# Patient Record
Sex: Female | Born: 1995 | Race: Black or African American | Hispanic: No | Marital: Single | Smoking: Never smoker
Health system: Southern US, Community
[De-identification: ages and names within clinical notes are randomized; demographics above are authoritative.]

## PROBLEM LIST (undated history)

## (undated) DIAGNOSIS — R011 Cardiac murmur, unspecified: Secondary | ICD-10-CM

## (undated) HISTORY — PX: TONSILLECTOMY: SUR1361

## (undated) HISTORY — PX: IUD REMOVAL: SHX5392

---

## 2019-01-06 ENCOUNTER — Other Ambulatory Visit: Payer: Self-pay

## 2019-01-06 ENCOUNTER — Emergency Department (HOSPITAL_COMMUNITY)
Admission: EM | Admit: 2019-01-06 | Discharge: 2019-01-06 | Disposition: A | Discharge status: Home / Self Care | Payer: BLUE CROSS/BLUE SHIELD | Attending: Emergency Medicine | Admitting: Emergency Medicine

## 2019-01-06 ENCOUNTER — Emergency Department (HOSPITAL_COMMUNITY): Payer: BLUE CROSS/BLUE SHIELD

## 2019-01-06 DIAGNOSIS — J029 Acute pharyngitis, unspecified: Secondary | ICD-10-CM | POA: Diagnosis present

## 2019-01-06 DIAGNOSIS — N938 Other specified abnormal uterine and vaginal bleeding: Secondary | ICD-10-CM

## 2019-01-06 DIAGNOSIS — J020 Streptococcal pharyngitis: Secondary | ICD-10-CM | POA: Diagnosis not present

## 2019-01-06 LAB — CBC WITH DIFFERENTIAL/PLATELET
Abs Immature Granulocytes: 0.01 10*3/uL (ref 0.00–0.07)
Basophils Absolute: 0 10*3/uL (ref 0.0–0.1)
Basophils Relative: 0 %
Eosinophils Absolute: 0 10*3/uL (ref 0.0–0.5)
Eosinophils Relative: 1 %
HEMATOCRIT: 39.6 % (ref 36.0–46.0)
HEMOGLOBIN: 11.4 g/dL — AB (ref 12.0–15.0)
Immature Granulocytes: 0 %
Lymphocytes Relative: 22 %
Lymphs Abs: 1.8 10*3/uL (ref 0.7–4.0)
MCH: 22.5 pg — ABNORMAL LOW (ref 26.0–34.0)
MCHC: 28.8 g/dL — ABNORMAL LOW (ref 30.0–36.0)
MCV: 78.3 fL — ABNORMAL LOW (ref 80.0–100.0)
Monocytes Absolute: 0.5 10*3/uL (ref 0.1–1.0)
Monocytes Relative: 6 %
Neutro Abs: 5.7 10*3/uL (ref 1.7–7.7)
Neutrophils Relative %: 71 %
Platelets: 552 10*3/uL — ABNORMAL HIGH (ref 150–400)
RBC: 5.06 MIL/uL (ref 3.87–5.11)
RDW: 15.3 % (ref 11.5–15.5)
WBC: 8.1 10*3/uL (ref 4.0–10.5)
nRBC: 0 % (ref 0.0–0.2)

## 2019-01-06 LAB — GROUP A STREP BY PCR: Group A Strep by PCR: DETECTED — AB

## 2019-01-06 LAB — BASIC METABOLIC PANEL
Anion gap: 8 (ref 5–15)
BUN: <5 mg/dL — ABNORMAL LOW (ref 6–20)
CO2: 24 mmol/L (ref 22–32)
Calcium: 9.3 mg/dL (ref 8.9–10.3)
Chloride: 107 mmol/L (ref 98–111)
Creatinine, Ser: 0.81 mg/dL (ref 0.44–1.00)
GFR calc Af Amer: >60 mL/min (ref >60)
GFR calc non Af Amer: >60 mL/min (ref >60)
Glucose, Bld: 84 mg/dL (ref 70–99)
POTASSIUM: 3.6 mmol/L (ref 3.5–5.1)
Sodium: 139 mmol/L (ref 135–145)

## 2019-01-06 MED ORDER — FERROUS SULFATE 325 (65 FE) MG PO TABS: 325.0000 mg | ORAL_TABLET | Freq: Every day | ORAL | 0 refills | Status: DC

## 2019-01-06 MED ORDER — IOHEXOL 300 MG/ML  SOLN
75.0000 mL | Freq: Once | INTRAMUSCULAR | Status: AC | PRN
  Administered 2019-01-06: 75 mL via INTRAVENOUS

## 2019-01-06 MED ORDER — CLINDAMYCIN HCL 150 MG PO CAPS
300.0000 mg | ORAL_CAPSULE | Freq: Once | ORAL | Status: AC
  Administered 2019-01-06: 300 mg via ORAL
  Filled 2019-01-06: qty 2

## 2019-01-06 MED ORDER — CLINDAMYCIN HCL 300 MG PO CAPS: 300.0000 mg | ORAL_CAPSULE | Freq: Four times a day (QID) | ORAL | 0 refills | Status: DC

## 2019-01-06 MED ORDER — IBUPROFEN 600 MG PO TABS: 600.0000 mg | ORAL_TABLET | Freq: Four times a day (QID) | ORAL | 0 refills | Status: DC | PRN

## 2019-01-06 MED ORDER — KETOROLAC TROMETHAMINE 30 MG/ML IJ SOLN
30.0000 mg | Freq: Once | INTRAMUSCULAR | Status: AC
  Administered 2019-01-06: 30 mg via INTRAVENOUS
  Filled 2019-01-06: qty 1

## 2019-01-06 MED ORDER — DEXAMETHASONE SODIUM PHOSPHATE 10 MG/ML IJ SOLN
10.0000 mg | Freq: Once | INTRAMUSCULAR | Status: AC
  Administered 2019-01-06: 10 mg via INTRAVENOUS
  Filled 2019-01-06: qty 1

## 2019-01-06 NOTE — ED Notes (Signed)
Patient transported to CT 

## 2019-01-06 NOTE — ED Triage Notes (Signed)
Pt dx with strep throat last week, finished penicillin two days ago, and throat is hurting again today.

## 2019-01-06 NOTE — ED Provider Notes (Signed)
[facility] Provider Note   CSN: [id number] Arrival date & time: 01/06/19  1607     History   Chief Complaint Chief Complaint  Patient presents with  . Sore Throat    HPI Karen Garner is a 23 y.o. female.  Pt presents to the ED today with a sore throat.  She was diagnosed with strep 10 days ago and took her abx.  She said the throat pain went away, but has now come back on the left side.  She denies any fever.     No past medical history on file.  There are no active problems to display for this patient.     OB History   No obstetric history on file.      Home Medications    Prior to Admission medications   Medication Sig Start Date End Date Taking? Authorizing Provider  clindamycin (CLEOCIN) 300 MG capsule Take 1 capsule (300 mg total) by mouth every 6 (six) hours. 01/06/19   Jacalyn LefevreHaviland, Adam Sanjuan, MD  ferrous sulfate 325 (65 FE) MG tablet Take 1 tablet (325 mg total) by mouth daily. 01/06/19   Jacalyn LefevreHaviland, Kai Railsback, MD  ibuprofen (ADVIL,MOTRIN) 600 MG tablet Take 1 tablet (600 mg total) by mouth every 6 (six) hours as needed. 01/06/19   Jacalyn LefevreHaviland, Amita Atayde, MD    Family History No family history on file.  Social History Social History   Tobacco Use  . Smoking status: Not on file  Substance Use Topics  . Alcohol use: Not on file  . Drug use: Not on file     Allergies   Patient has no known allergies.   Review of Systems Review of Systems  HENT: Positive for sore throat.   All other systems reviewed and are negative.    Physical Exam Updated Vital Signs BP 131/85 (BP Location: Right Arm)   Pulse 96   Temp 98.7 F (37.1 C) (Oral)   Resp 16   Ht 5\' 3"  (1.6 m)   Wt (!) 140.2 kg   SpO2 99%   BMI 54.74 kg/m   Physical Exam Vitals signs and nursing note reviewed.  Constitutional:      Appearance: She is well-developed. She is obese.  HENT:     Head: Normocephalic and atraumatic.     Right Ear: Tympanic membrane  normal.     Left Ear: There is impacted cerumen.     Mouth/Throat:     Pharynx: Posterior oropharyngeal erythema present.  Eyes:     Conjunctiva/sclera: Conjunctivae normal.     Pupils: Pupils are equal, round, and reactive to light.  Cardiovascular:     Rate and Rhythm: Normal rate and regular rhythm.  Pulmonary:     Effort: Pulmonary effort is normal.     Breath sounds: Normal breath sounds.  Abdominal:     General: Bowel sounds are normal.     Palpations: Abdomen is soft.  Lymphadenopathy:     Cervical: Cervical adenopathy present.     Left cervical: Superficial cervical adenopathy present.  Skin:    General: Skin is warm and dry.     Capillary Refill: Capillary refill takes less than 2 seconds.  Neurological:     General: No focal deficit present.     Mental Status: She is alert and oriented to person, place, and time.  Psychiatric:        Mood and Affect: Mood normal.        Behavior: Behavior normal.  ED Treatments / Results  Labs (all labs ordered are listed, but only abnormal results are displayed) Labs Reviewed  GROUP A STREP BY PCR - Abnormal; Notable for the following components:      Result Value   Group A Strep by PCR DETECTED (*)    All other components within normal limits  CBC WITH DIFFERENTIAL/PLATELET - Abnormal; Notable for the following components:   Hemoglobin 11.4 (*)    MCV 78.3 (*)    MCH 22.5 (*)    MCHC 28.8 (*)    Platelets 552 (*)    All other components within normal limits  BASIC METABOLIC PANEL - Abnormal; Notable for the following components:   BUN <5 (*)    All other components within normal limits  GC/CHLAMYDIA PROBE AMP ([place]) [facility]    EKG None  Radiology No results found.  Procedures Procedures (including critical care time)  Medications Ordered in ED Medications  dexamethasone (DECADRON) injection 10 mg (10 mg Intravenous Given 01/06/19 1638)  ketorolac (TORADOL) 30 MG/ML injection 30 mg (30 mg  Intravenous Given 01/06/19 1638)  clindamycin (CLEOCIN) capsule 300 mg (300 mg Oral Given 01/06/19 1818)     Initial Impression / Assessment and Plan / ED Course  I have reviewed the triage vital signs and the nursing notes.  Pertinent labs & imaging results that were available during my care of the patient were reviewed by me and considered in my medical decision making (see chart for details).    Hgb slightly low (11.2).  Pt is on depo, but has been having heavy bleeding for 2 months.  She will be put on iron.    Pt is feeling better after decadron and toradol.  CT soft tissue neck pending at shift change.  I anticipate it will be normal.  Pt requested to be checked for STDs in her throat.    Pt signed out to PA Gekas.   Final Clinical Impressions(s) / ED Diagnoses   Final diagnoses:  Strep pharyngitis  DUB (dysfunctional uterine bleeding)    ED Discharge Orders         Ordered    clindamycin (CLEOCIN) 300 MG capsule  Every 6 hours     01/06/19 1758    ferrous sulfate 325 (65 FE) MG tablet  Daily     01/06/19 1758    ibuprofen (ADVIL,MOTRIN) 600 MG tablet  Every 6 hours PRN     01/06/19 Lambert [id number]           Hezakiah Champeau, MD 01/06/19 [phone]

## 2019-01-06 NOTE — Discharge Instructions (Addendum)
Take Clindamycin for throat infection Take Ibuprofen for pain Take Iron because you are anemic (blood counts are slightly low) Return if worsening

## 2019-01-08 LAB — GC/CHLAMYDIA PROBE AMP (~~LOC~~) NOT AT ARMC
CHLAMYDIA, DNA PROBE: NEGATIVE
NEISSERIA GONORRHEA: NEGATIVE

## 2021-02-28 ENCOUNTER — Other Ambulatory Visit: Payer: Self-pay

## 2021-02-28 ENCOUNTER — Encounter (HOSPITAL_COMMUNITY): Payer: Self-pay | Admitting: Emergency Medicine

## 2021-02-28 ENCOUNTER — Emergency Department (HOSPITAL_COMMUNITY)
Admission: EM | Admit: 2021-02-28 | Discharge: 2021-03-01 | Disposition: A | Discharge status: Home / Self Care | Payer: BC Managed Care – PPO | Attending: Emergency Medicine | Admitting: Emergency Medicine

## 2021-02-28 DIAGNOSIS — R1031 Right lower quadrant pain: Secondary | ICD-10-CM | POA: Diagnosis present

## 2021-02-28 DIAGNOSIS — N83201 Unspecified ovarian cyst, right side: Secondary | ICD-10-CM

## 2021-02-28 DIAGNOSIS — N83291 Other ovarian cyst, right side: Secondary | ICD-10-CM | POA: Insufficient documentation

## 2021-02-28 LAB — I-STAT BETA HCG BLOOD, ED (MC, WL, AP ONLY): I-stat hCG, quantitative: <5 m[IU]/mL

## 2021-02-28 MED ORDER — ONDANSETRON HCL 4 MG/2ML IJ SOLN
4.0000 mg | Freq: Once | INTRAMUSCULAR | Status: AC
  Administered 2021-02-28: 4 mg via INTRAVENOUS
  Filled 2021-02-28: qty 2

## 2021-02-28 MED ORDER — MORPHINE SULFATE (PF) 4 MG/ML IV SOLN
4.0000 mg | Freq: Once | INTRAVENOUS | Status: AC
  Administered 2021-02-28: 4 mg via INTRAVENOUS
  Filled 2021-02-28: qty 1

## 2021-02-28 NOTE — ED Triage Notes (Signed)
Pt reports R flank pain and groin pain x 2 days. States that the pain is radiating up to her abdomen. Denies N/V. States that she has pressure with urination. Denies hematuria.

## 2021-02-28 NOTE — ED Notes (Signed)
Patient unable to urinate at this time. 

## 2021-02-28 NOTE — ED Provider Notes (Signed)
MSE was initiated and I personally evaluated the patient and placed orders (if any) at  10:34 PM on February 28, 2021.  Patient here with abdominal pain.  Mainly RLQ.  Worse with walking.  No fever. No vomiting, but nauseated.  Discussed with patient that their care has been initiated.   They are counseled that they will need to remain in the ED until the completion of their workup, including full H&P and results of any tests.  Risks of leaving the emergency department prior to completion of treatment were discussed. Patient was advised to inform ED staff if they are leaving before their treatment is complete. The patient acknowledged these risks and time was allowed for questions.    The patient appears stable so that the remainder of the MSE may be completed by another provider.    Roxy Horseman, PA-C 02/28/21 2235    Milagros Loll, MD 03/02/21 [phone]

## 2021-03-01 ENCOUNTER — Emergency Department (HOSPITAL_COMMUNITY): Payer: BC Managed Care – PPO

## 2021-03-01 ENCOUNTER — Encounter (HOSPITAL_COMMUNITY): Payer: Self-pay

## 2021-03-01 LAB — COMPREHENSIVE METABOLIC PANEL
ALT: 29 U/L (ref 0–44)
AST: 19 U/L (ref 15–41)
Albumin: 3.8 g/dL (ref 3.5–5.0)
Alkaline Phosphatase: 71 U/L (ref 38–126)
Anion gap: 8 (ref 5–15)
BUN: 10 mg/dL (ref 6–20)
CO2: 24 mmol/L (ref 22–32)
Calcium: 9.4 mg/dL (ref 8.9–10.3)
Chloride: 105 mmol/L (ref 98–111)
Creatinine, Ser: 0.63 mg/dL (ref 0.44–1.00)
GFR, Estimated: >60 mL/min (ref >60)
Glucose, Bld: 86 mg/dL (ref 70–99)
Potassium: 3.9 mmol/L (ref 3.5–5.1)
Sodium: 137 mmol/L (ref 135–145)
Total Bilirubin: 0.5 mg/dL (ref 0.3–1.2)
Total Protein: 8.3 g/dL — ABNORMAL HIGH (ref 6.5–8.1)

## 2021-03-01 LAB — CBC WITH DIFFERENTIAL/PLATELET
Abs Immature Granulocytes: 0.02 10*3/uL (ref 0.00–0.07)
Basophils Absolute: 0 10*3/uL (ref 0.0–0.1)
Basophils Relative: 0 %
Eosinophils Absolute: 0.1 10*3/uL (ref 0.0–0.5)
Eosinophils Relative: 1 %
HCT: 42.1 % (ref 36.0–46.0)
Hemoglobin: 12.6 g/dL (ref 12.0–15.0)
Immature Granulocytes: 0 %
Lymphocytes Relative: 24 %
Lymphs Abs: 2.1 10*3/uL (ref 0.7–4.0)
MCH: 24.3 pg — ABNORMAL LOW (ref 26.0–34.0)
MCHC: 29.9 g/dL — ABNORMAL LOW (ref 30.0–36.0)
MCV: 81.1 fL (ref 80.0–100.0)
Monocytes Absolute: 0.6 10*3/uL (ref 0.1–1.0)
Monocytes Relative: 7 %
Neutro Abs: 5.8 10*3/uL (ref 1.7–7.7)
Neutrophils Relative %: 68 %
Platelets: 502 10*3/uL — ABNORMAL HIGH (ref 150–400)
RBC: 5.19 MIL/uL — ABNORMAL HIGH (ref 3.87–5.11)
RDW: 15 % (ref 11.5–15.5)
WBC: 8.5 10*3/uL (ref 4.0–10.5)
nRBC: 0 % (ref 0.0–0.2)

## 2021-03-01 LAB — URINALYSIS, ROUTINE W REFLEX MICROSCOPIC
Bilirubin Urine: NEGATIVE
Glucose, UA: NEGATIVE mg/dL
Hgb urine dipstick: NEGATIVE
Ketones, ur: NEGATIVE mg/dL
Leukocytes,Ua: NEGATIVE
Nitrite: NEGATIVE
Protein, ur: NEGATIVE mg/dL
Specific Gravity, Urine: 1.015 (ref 1.005–1.030)
pH: 5.5 (ref 5.0–8.0)

## 2021-03-01 LAB — LIPASE, BLOOD: Lipase: 26 U/L (ref 11–51)

## 2021-03-01 MED ORDER — IBUPROFEN 800 MG PO TABS: 800.0000 mg | ORAL_TABLET | Freq: Three times a day (TID) | ORAL | 0 refills | Status: DC

## 2021-03-01 MED ORDER — ONDANSETRON HCL 4 MG/2ML IJ SOLN
INTRAMUSCULAR | Status: AC
  Filled 2021-03-01: qty 2

## 2021-03-01 MED ORDER — IOHEXOL 300 MG/ML  SOLN
100.0000 mL | Freq: Once | INTRAMUSCULAR | Status: AC | PRN
  Administered 2021-03-01: 100 mL via INTRAVENOUS

## 2021-03-01 MED ORDER — ONDANSETRON HCL 4 MG/2ML IJ SOLN
4.0000 mg | Freq: Once | INTRAMUSCULAR | Status: AC
  Administered 2021-03-01: 4 mg via INTRAVENOUS

## 2021-03-01 NOTE — ED Provider Notes (Signed)
[facility]-EMERGENCY DEPT Provider Note   CSN: [id number] Arrival date & time: 02/28/21  2156     History Chief Complaint  Patient presents with  . Flank Pain    Karen Garner is a 25 y.o. female.  Patient presents to the emergency department with a chief complaint of right lower quadrant abdominal pain.  She states that the pain started a couple of days ago and has gradually worsened.  She states that it is worse with palpation and when she walks.  She reports urinary pressure, but denies dysuria or hematuria.  She denies any vomiting, but has felt nauseated.  She denies fevers chills.  Denies any treatments prior to arrival.  The history is provided by the patient. No language interpreter was used.       History reviewed. No pertinent past medical history.  There are no problems to display for this patient.   History reviewed. No pertinent surgical history.   OB History   No obstetric history on file.     History reviewed. No pertinent family history.     Home Medications Prior to Admission medications   Medication Sig Start Date End Date Taking? Authorizing Provider  clindamycin (CLEOCIN) 300 MG capsule Take 1 capsule (300 mg total) by mouth every 6 (six) hours. 01/06/19   Jacalyn Lefevre, MD  ferrous sulfate 325 (65 FE) MG tablet Take 1 tablet (325 mg total) by mouth daily. 01/06/19   Jacalyn Lefevre, MD  ibuprofen (ADVIL,MOTRIN) 600 MG tablet Take 1 tablet (600 mg total) by mouth every 6 (six) hours as needed. 01/06/19   Jacalyn Lefevre, MD    Allergies    Patient has no known allergies.  Review of Systems   Review of Systems  All other systems reviewed and are negative.   Physical Exam Updated Vital Signs BP 134/79   Pulse 89   Temp 98.8 F (37.1 C) (Oral)   Resp 18   Ht 5\' 3"  (1.6 m)   Wt (!) 140.6 kg   SpO2 100%   BMI 54.91 kg/m   Physical Exam Vitals and nursing note reviewed.  Constitutional:      General: She is not in  acute distress.    Appearance: She is well-developed.  HENT:     Head: Normocephalic and atraumatic.  Eyes:     Conjunctiva/sclera: Conjunctivae normal.  Cardiovascular:     Rate and Rhythm: Normal rate.  Pulmonary:     Effort: Pulmonary effort is normal. No respiratory distress.     Breath sounds: Normal breath sounds.  Abdominal:     Palpations: Abdomen is soft.     Tenderness: There is abdominal tenderness.     Comments: There is right lower quadrant tenderness  Musculoskeletal:     Cervical back: Neck supple.  Skin:    General: Skin is warm and dry.  Neurological:     Mental Status: She is alert.     ED Results / Procedures / Treatments   Labs (all labs ordered are listed, but only abnormal results are displayed) Labs Reviewed  URINALYSIS, ROUTINE W REFLEX MICROSCOPIC  CBC WITH DIFFERENTIAL/PLATELET  COMPREHENSIVE METABOLIC PANEL  LIPASE, BLOOD  I-STAT BETA HCG BLOOD, ED ([facility], [facility], [facility] ONLY)    EKG None  Radiology No results found.  Procedures Procedures   Medications Ordered in ED Medications  morphine 4 MG/ML injection 4 mg (4 mg Intravenous Given 02/28/21 2338)  ondansetron (ZOFRAN) injection 4 mg (4 mg Intravenous Given 02/28/21 2339)  ED Course  I have reviewed the triage vital signs and the nursing notes.  Pertinent labs & imaging results that were available during my care of the patient were reviewed by me and considered in my medical decision making (see chart for details).    MDM Rules/Calculators/A&P                          This patient complains of right lower abdominal pain this involves an extensive number of treatment options, and is a complaint that carries with it a high risk of complications and morbidity.    Differential Dx Appendicitis, kidney stone, ovarian torsion, ovarian cyst, UTI  Pertinent Labs I ordered, reviewed, and interpreted labs, which included CBC, CMP, lipase, urinalysis, hCG; laboratory work-up is  reassuring  Imaging Interpretation I ordered imaging studies which included CT abdomen/pelvis which showed right ovarian cyst, this was confirmed on ultrasound, no evidence of torsion, most consistent with hemorrhagic cyst.   Medications I ordered medication morphine and Zofran for pain and nausea.  Reassessments After the interventions stated above, I reevaluated the patient and found feeling improved.  Reassured with her results.  Plan Discharged home with PCP follow-up.    Final Clinical Impression(s) / ED Diagnoses Final diagnoses:  Hemorrhagic cyst of right ovary    Rx / DC Orders ED Discharge Orders    None       Roxy Horseman, PA-C 03/01/21 0443    Melene Plan, DO 03/01/21 [phone]

## 2021-03-01 NOTE — ED Notes (Signed)
Ultrasound at bedside

## 2021-03-01 NOTE — ED Notes (Signed)
Patient transported to CT 

## 2021-03-01 NOTE — ED Notes (Signed)
Asked patient to provide urine sample. Patient said she wants to wait until her ultrasound and then she will provide sample.

## 2021-05-05 ENCOUNTER — Emergency Department (HOSPITAL_COMMUNITY): Payer: BC Managed Care – PPO

## 2021-05-05 ENCOUNTER — Encounter (HOSPITAL_COMMUNITY): Payer: Self-pay | Admitting: *Deleted

## 2021-05-05 ENCOUNTER — Emergency Department (HOSPITAL_COMMUNITY)
Admission: EM | Admit: 2021-05-05 | Discharge: 2021-05-05 | Disposition: A | Discharge status: Home / Self Care | Payer: BC Managed Care – PPO | Attending: Emergency Medicine | Admitting: Emergency Medicine

## 2021-05-05 DIAGNOSIS — R1084 Generalized abdominal pain: Secondary | ICD-10-CM | POA: Diagnosis not present

## 2021-05-05 DIAGNOSIS — R1013 Epigastric pain: Secondary | ICD-10-CM | POA: Diagnosis not present

## 2021-05-05 DIAGNOSIS — R1010 Upper abdominal pain, unspecified: Secondary | ICD-10-CM

## 2021-05-05 LAB — URINALYSIS, ROUTINE W REFLEX MICROSCOPIC
Bacteria, UA: NONE SEEN
Bilirubin Urine: NEGATIVE
Glucose, UA: NEGATIVE mg/dL
Hgb urine dipstick: NEGATIVE
Ketones, ur: NEGATIVE mg/dL
Nitrite: NEGATIVE
Protein, ur: NEGATIVE mg/dL
Specific Gravity, Urine: 1.026 (ref 1.005–1.030)
pH: 5 (ref 5.0–8.0)

## 2021-05-05 LAB — CBC WITH DIFFERENTIAL/PLATELET
Abs Immature Granulocytes: 0.01 10*3/uL (ref 0.00–0.07)
Basophils Absolute: 0 10*3/uL (ref 0.0–0.1)
Basophils Relative: 0 %
Eosinophils Absolute: 0 10*3/uL (ref 0.0–0.5)
Eosinophils Relative: 1 %
HCT: 39.6 % (ref 36.0–46.0)
Hemoglobin: 11.8 g/dL — ABNORMAL LOW (ref 12.0–15.0)
Immature Granulocytes: 0 %
Lymphocytes Relative: 24 %
Lymphs Abs: 1.6 10*3/uL (ref 0.7–4.0)
MCH: 24.2 pg — ABNORMAL LOW (ref 26.0–34.0)
MCHC: 29.8 g/dL — ABNORMAL LOW (ref 30.0–36.0)
MCV: 81.1 fL (ref 80.0–100.0)
Monocytes Absolute: 0.4 10*3/uL (ref 0.1–1.0)
Monocytes Relative: 6 %
Neutro Abs: 4.5 10*3/uL (ref 1.7–7.7)
Neutrophils Relative %: 69 %
Platelets: 495 10*3/uL — ABNORMAL HIGH (ref 150–400)
RBC: 4.88 MIL/uL (ref 3.87–5.11)
RDW: 15.1 % (ref 11.5–15.5)
WBC: 6.5 10*3/uL (ref 4.0–10.5)
nRBC: 0 % (ref 0.0–0.2)

## 2021-05-05 LAB — COMPREHENSIVE METABOLIC PANEL
ALT: 37 U/L (ref 0–44)
AST: 23 U/L (ref 15–41)
Albumin: 3.8 g/dL (ref 3.5–5.0)
Alkaline Phosphatase: 70 U/L (ref 38–126)
Anion gap: 5 (ref 5–15)
BUN: 8 mg/dL (ref 6–20)
CO2: 23 mmol/L (ref 22–32)
Calcium: 9.1 mg/dL (ref 8.9–10.3)
Chloride: 108 mmol/L (ref 98–111)
Creatinine, Ser: 0.87 mg/dL (ref 0.44–1.00)
GFR, Estimated: >60 mL/min (ref >60)
Glucose, Bld: 87 mg/dL (ref 70–99)
Potassium: 4.2 mmol/L (ref 3.5–5.1)
Sodium: 136 mmol/L (ref 135–145)
Total Bilirubin: 0.4 mg/dL (ref 0.3–1.2)
Total Protein: 8.3 g/dL — ABNORMAL HIGH (ref 6.5–8.1)

## 2021-05-05 LAB — LIPASE, BLOOD: Lipase: 27 U/L (ref 11–51)

## 2021-05-05 LAB — I-STAT BETA HCG BLOOD, ED (MC, WL, AP ONLY): I-stat hCG, quantitative: <5 m[IU]/mL (ref <5)

## 2021-05-05 MED ORDER — PANTOPRAZOLE SODIUM 20 MG PO TBEC: 20.0000 mg | DELAYED_RELEASE_TABLET | Freq: Every day | ORAL | 1 refills | Status: DC

## 2021-05-05 NOTE — ED Provider Notes (Signed)
Emergency Medicine Provider Triage Evaluation Note  Karen Garner , a 25 y.o. female  was evaluated in triage.  Pt complains of abdominal pain.  Reports symptoms started a little over a week ago with some generalized abdominal pain and diarrhea.  Was seen at urgent care and prescribed Bentyl, despite taking this her symptoms have not improved.  She continues to have persistent diarrhea and reports some worsening abdominal pain, primarily worse in the middle and in the right upper quadrant.  No fevers, nausea but no vomiting.  Review of Systems  Positive: Abdominal pain, nausea, diarrhea Negative: Fever, vomiting, blood in stool  Physical Exam  BP (!) 149/105 (BP Location: Left Arm)   Pulse 88   Temp 99 F (37.2 C) (Oral)   Resp 16   SpO2 98%  Gen:   Awake, no distress   Resp:  Normal effort  MSK:   Moves extremities without difficulty  Other:  Abdomen soft, nondistended, there is some generalized abdominal tenderness that does not localize.  Medical Decision Making  Medically screening exam initiated at 1:02 PM.  Appropriate orders placed.  Karen Garner was informed that the remainder of the evaluation will be completed by another provider, this initial triage assessment does not replace that evaluation, and the importance of remaining in the ED until their evaluation is complete.     Dartha Lodge, PA-C 05/05/21 1308    Derwood Kaplan, MD 05/05/21 1546

## 2021-05-05 NOTE — ED Triage Notes (Signed)
Pt complains of abdominal pain since 6/1. She went to urgent care and got some medication she can't remember the name of, felt better. Started feeling bad again yesterday. Also has diarrhea, no emesis.

## 2021-05-05 NOTE — ED Provider Notes (Signed)
[facility]-EMERGENCY DEPT Provider Note   CSN: [id number] Arrival date & time: 05/05/21  1227     History Chief Complaint  Patient presents with  . Abdominal Pain    Karen Garner is a 25 y.o. female.  Patient complains of abdominal discomfort.  Her pain is mostly epigastric.  No vomiting  The history is provided by the patient and medical records. No language interpreter was used.  Abdominal Pain Pain location:  Generalized Pain quality: aching   Pain radiates to:  Does not radiate Pain severity:  Moderate Onset quality:  Sudden Timing:  Intermittent Chronicity:  New Context: not alcohol use   Associated symptoms: no chest pain, no cough, no diarrhea, no fatigue and no hematuria        History reviewed. No pertinent past medical history.  There are no problems to display for this patient.   History reviewed. No pertinent surgical history.   OB History   No obstetric history on file.     No family history on file.     Home Medications Prior to Admission medications   Medication Sig Start Date End Date Taking? Authorizing Provider  acetaminophen (TYLENOL) 500 MG tablet Take 1,000 mg by mouth every 6 (six) hours as needed for mild pain, fever or headache.   Yes [provider]  CRANBERRY PO Take 1 tablet by mouth daily.   Yes [provider]  dicyclomine (BENTYL) 20 MG tablet Take 20 mg by mouth 3 (three) times daily as needed for pain. 04/29/21  Yes [provider]  fluticasone (FLONASE) 50 MCG/ACT nasal spray Place 1-2 sprays into both nostrils daily as needed for allergies or rhinitis.   Yes [provider]  ondansetron (ZOFRAN) 8 MG tablet Take 8 mg by mouth every 8 (eight) hours as needed for nausea/vomiting. 04/29/21  Yes [provider]  pantoprazole (PROTONIX) 20 MG tablet Take 1 tablet (20 mg total) by mouth daily. 05/05/21  Yes Bethann Berkshire, MD  VITAMIN D PO Take 1 capsule by mouth daily.    Yes [provider]  WEGOVY 2.4 MG/0.75ML SOAJ Inject 2.4 mg into the skin once a week. 04/15/21  Yes [provider]  ferrous sulfate 325 (65 FE) MG tablet Take 1 tablet (325 mg total) by mouth daily. Patient not taking: Reported on 05/05/2021 01/06/19   Jacalyn Lefevre, MD  ibuprofen (ADVIL) 800 MG tablet Take 1 tablet (800 mg total) by mouth 3 (three) times daily. Patient not taking: Reported on 05/05/2021 03/01/21   Roxy Horseman, PA-C    Allergies    Patient has no known allergies.  Review of Systems   Review of Systems  Constitutional: Negative for appetite change and fatigue.  HENT: Negative for congestion, ear discharge and sinus pressure.   Eyes: Negative for discharge.  Respiratory: Negative for cough.   Cardiovascular: Negative for chest pain.  Gastrointestinal: Positive for abdominal pain. Negative for diarrhea.  Genitourinary: Negative for frequency and hematuria.  Musculoskeletal: Negative for back pain.  Skin: Negative for rash.  Neurological: Negative for seizures and headaches.  Psychiatric/Behavioral: Negative for hallucinations.    Physical Exam Updated Vital Signs BP (!) 154/76   Pulse 75   Temp 98.1 F (36.7 C) (Oral)   Resp 16   SpO2 98%   Physical Exam Constitutional:      Appearance: Normal appearance. She is well-developed.  HENT:     Head: Normocephalic.     Nose: Nose normal.  Eyes:  General: No scleral icterus.    Conjunctiva/sclera: Conjunctivae normal.  Neck:     Thyroid: No thyromegaly.  Cardiovascular:     Rate and Rhythm: Normal rate and regular rhythm.     Heart sounds: No murmur heard. No friction rub. No gallop.   Pulmonary:     Breath sounds: No stridor. No wheezing or rales.  Chest:     Chest wall: No tenderness.  Abdominal:     General: There is no distension.     Tenderness: There is abdominal tenderness. There is no rebound.  Musculoskeletal:        General: Normal range of motion.     Cervical back:  Neck supple.  Lymphadenopathy:     Cervical: No cervical adenopathy.  Skin:    Findings: No erythema or rash.  Neurological:     Mental Status: She is alert and oriented to person, place, and time.     Motor: No abnormal muscle tone.     Coordination: Coordination normal.  Psychiatric:        Behavior: Behavior normal.     ED Results / Procedures / Treatments   Labs (all labs ordered are listed, but only abnormal results are displayed) Labs Reviewed  COMPREHENSIVE METABOLIC PANEL - Abnormal; Notable for the following components:      Result Value   Total Protein 8.3 (*)    All other components within normal limits  CBC WITH DIFFERENTIAL/PLATELET - Abnormal; Notable for the following components:   Hemoglobin 11.8 (*)    MCH 24.2 (*)    MCHC 29.8 (*)    Platelets 495 (*)    All other components within normal limits  URINALYSIS, ROUTINE W REFLEX MICROSCOPIC - Abnormal; Notable for the following components:   APPearance HAZY (*)    Leukocytes,Ua TRACE (*)    All other components within normal limits  LIPASE, BLOOD  I-STAT BETA HCG BLOOD, ED ([facility], [facility], [facility] ONLY)    EKG None  Radiology CT Abdomen Pelvis Wo Contrast  Result Date: 05/05/2021 CLINICAL DATA:  Acute, non localized abdominal pain and diarrhea. EXAM: CT ABDOMEN AND PELVIS WITHOUT CONTRAST TECHNIQUE: Multidetector CT imaging of the abdomen and pelvis was performed following the standard protocol without IV contrast. COMPARISON:  03/01/2021. FINDINGS: Lower chest: No acute abnormality. Hepatobiliary: Probable sludge in the gallbladder. Normal appearing liver. Pancreas: Unremarkable. No pancreatic ductal dilatation or surrounding inflammatory changes. Spleen: Normal in size without focal abnormality. Adrenals/Urinary Tract: Adrenal glands are unremarkable. Kidneys are normal, without renal calculi, focal lesion, or hydronephrosis. Bladder is unremarkable. Stomach/Bowel: Stomach is within normal limits. Appendix appears normal.  No evidence of bowel wall thickening, distention, or inflammatory changes. Vascular/Lymphatic: No significant vascular findings are present. No enlarged abdominal or pelvic lymph nodes. Reproductive: Stable intrauterine device in expected position in the uterus. The previously demonstrated right ovarian cyst has resolved. No adnexal abnormalities currently seen. Other: No abdominal wall hernia or abnormality. No abdominopelvic ascites. Musculoskeletal: Unremarkable. IMPRESSION: No acute abnormality. Electronically Signed   By: Beckie Salts M.D.   On: 05/05/2021 14:11    Procedures Procedures   Medications Ordered in ED Medications - No data to display  ED Course  I have reviewed the triage vital signs and the nursing notes.  Pertinent labs & imaging results that were available during my care of the patient were reviewed by me and considered in my medical decision making (see chart for details).    MDM Rules/Calculators/A&P  Labs and CT scan negative.  Patient put on Protonix and referred to GI Final Clinical Impression(s) / ED Diagnoses Final diagnoses:  Pain of upper abdomen    Rx / DC Orders ED Discharge Orders         Ordered    pantoprazole (PROTONIX) 20 MG tablet  Daily        05/05/21 1751           Bethann Berkshire, MD 05/05/21 1758

## 2021-05-05 NOTE — Discharge Instructions (Addendum)
Follow-up with [facility] gastroenterologist in the next couple weeks.

## 2021-05-06 ENCOUNTER — Encounter: Payer: Self-pay | Admitting: Nurse Practitioner

## 2021-06-05 ENCOUNTER — Ambulatory Visit: Payer: BC Managed Care – PPO | Admitting: Nurse Practitioner

## 2021-12-05 IMAGING — US US TRANSVAGINAL NON-OB
1 series · 13 of 25 positions shown · non-contrast
Comparison: Prior CT from earlier the same day.

CLINICAL DATA: Initial evaluation for acute right adnexal pain,
cyst, evaluate for torsion

EXAM:
TRANSABDOMINAL AND TRANSVAGINAL ULTRASOUND OF PELVIS
DOPPLER ULTRASOUND OF OVARIES
TECHNIQUE: Both transabdominal and transvaginal ultrasound examinations of the
pelvis were performed. Transabdominal technique was performed for
global imaging of the pelvis including uterus, ovaries, adnexal
regions, and pelvic cul-de-sac.
It was necessary to proceed with endovaginal exam following the
transabdominal exam to visualize the uterus, endometrium, and
ovaries. Color and duplex Doppler ultrasound was utilized to
evaluate blood flow to the ovaries.

[Series 1: us transvaginal non-ob · 66 acquisitions, 13 frames shown]
[im 1/66]
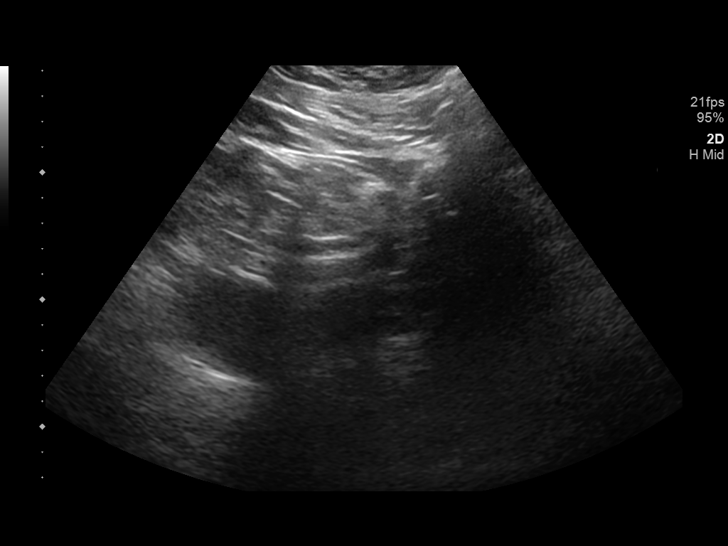
[im 6/66]
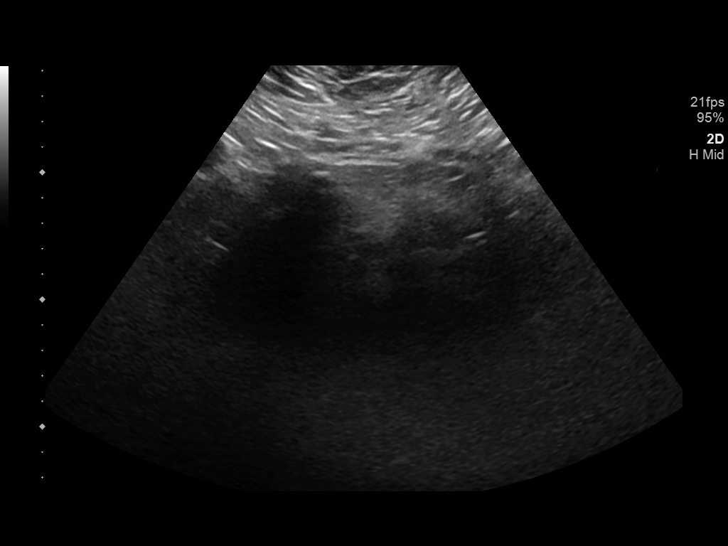
[im 11/66]
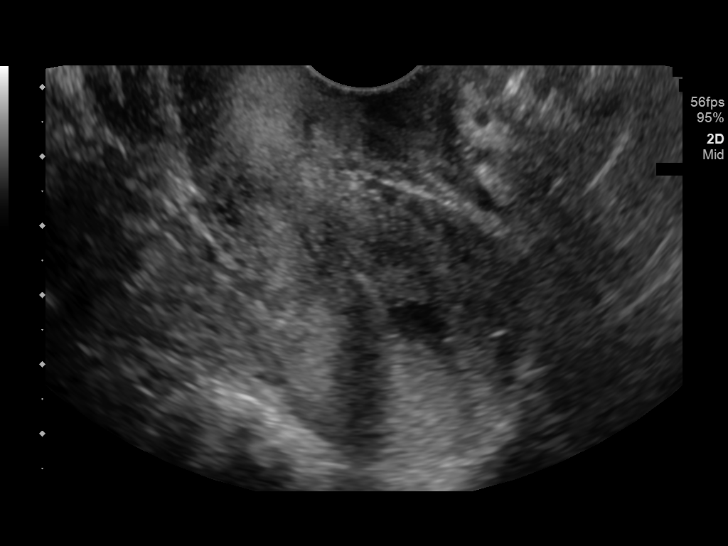
[im 17/66]
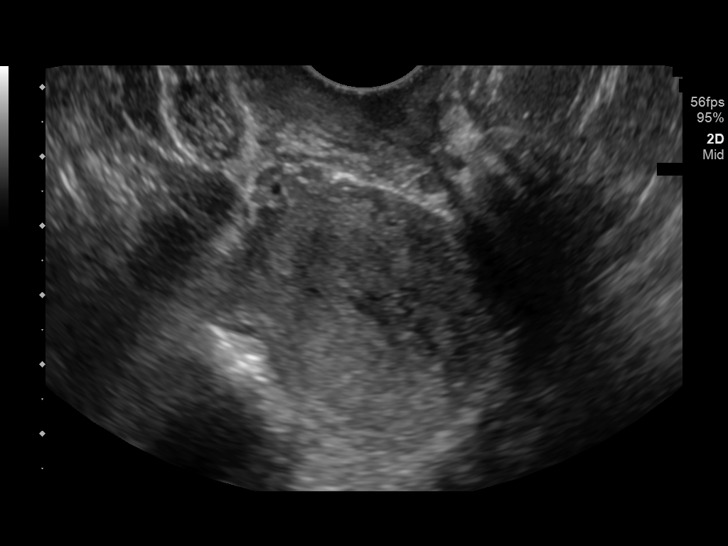
[im 22/66]
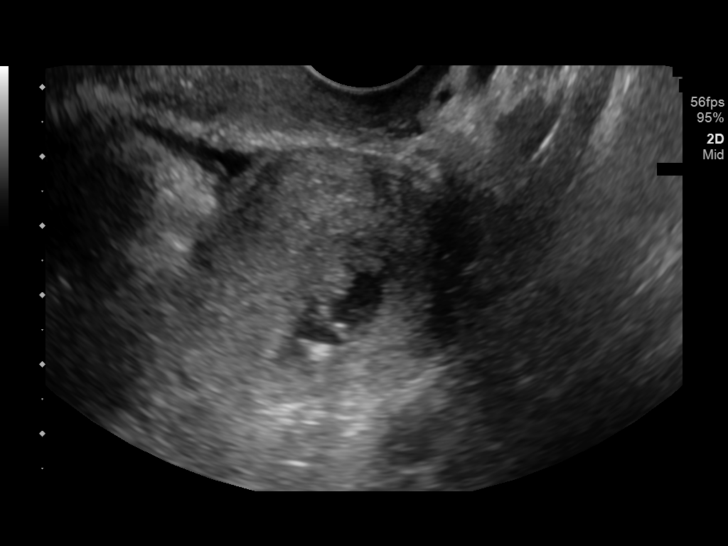
[im 28/66]
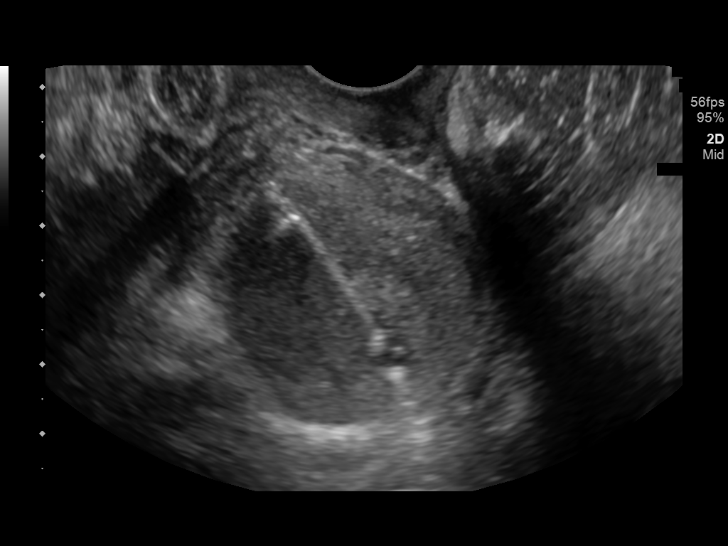
[im 33/66]
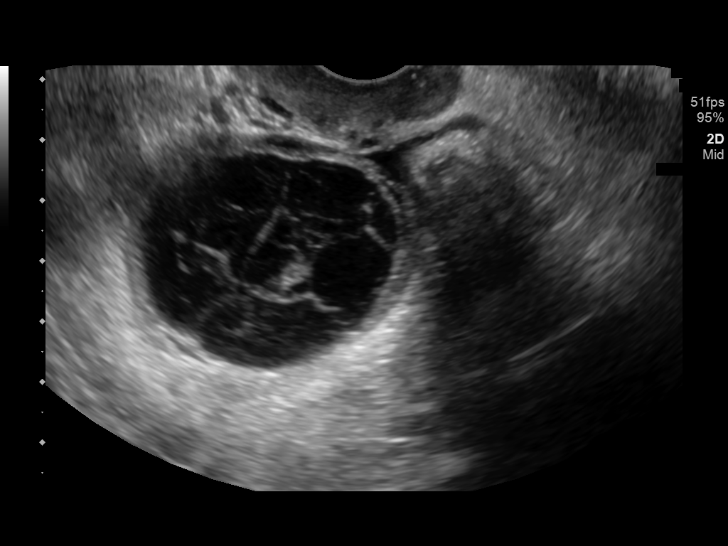
[im 38/66]
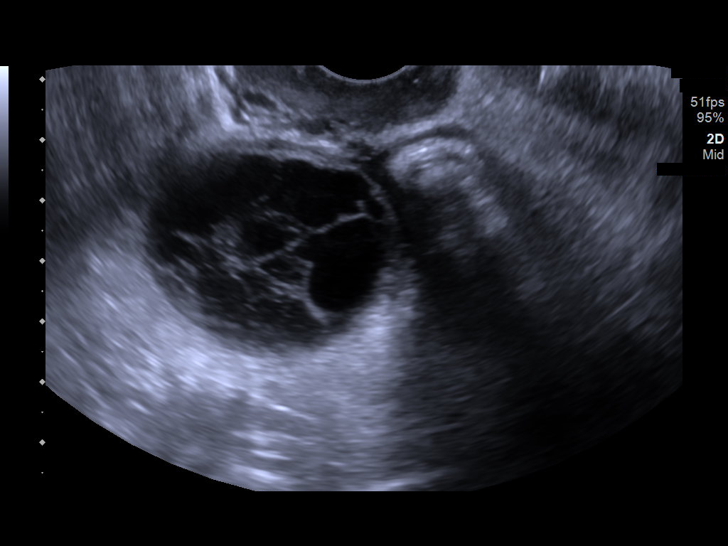
[im 44/66]
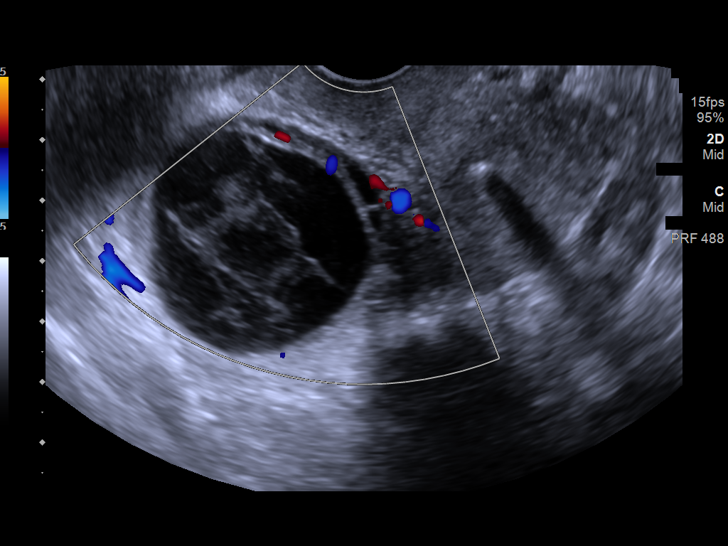
[im 49/66]
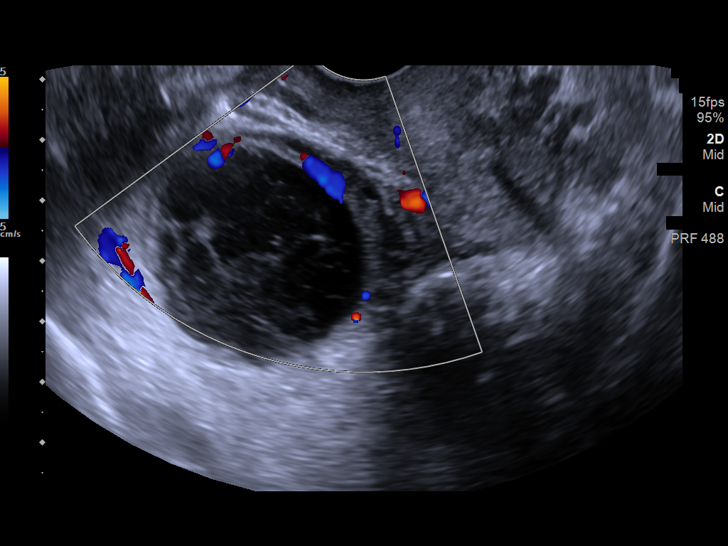
[im 55/66]
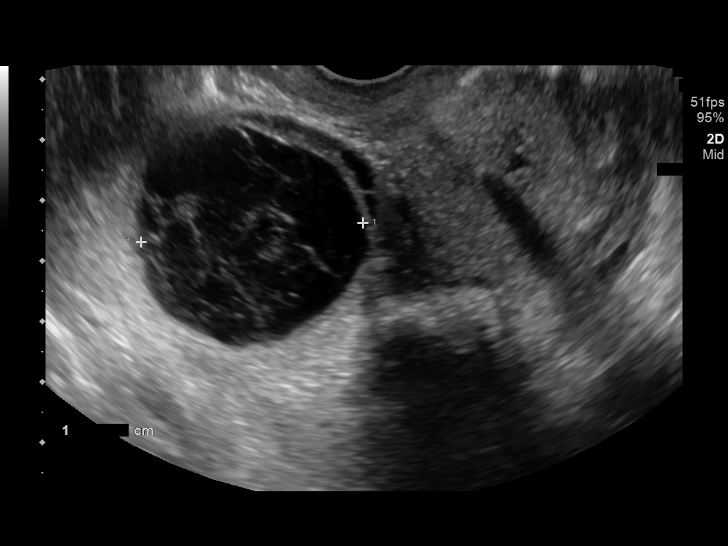
[im 60/66]
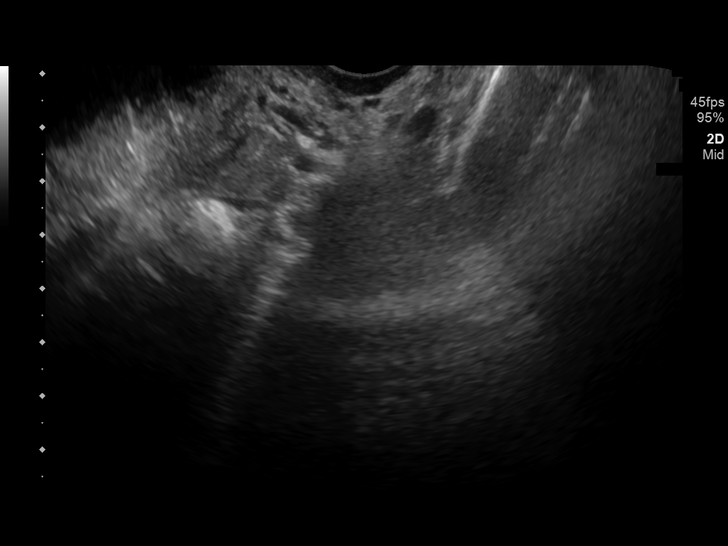
[im 66/66]
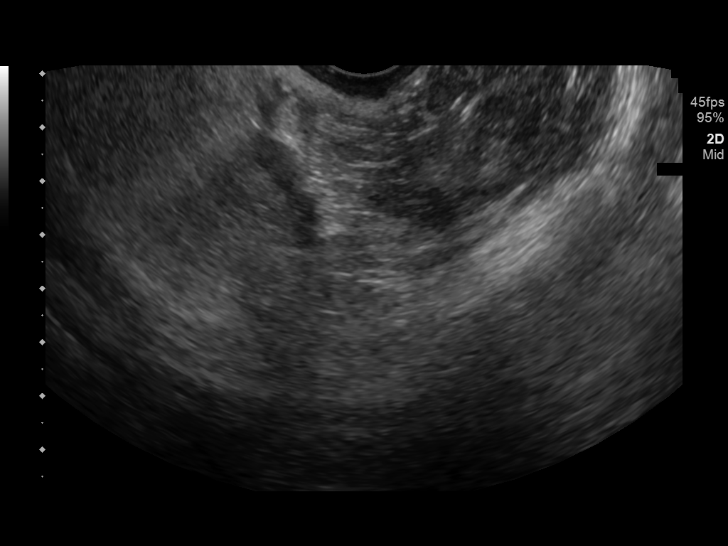

[13 of 25 positions shown; findings below may reference images not displayed]

FINDINGS: Uterus

Measurements: 7.4 x 4.1 x 4.1 cm = volume: 63.8 mL. Uterus is
retroverted. No discrete fibroid or other mass.

Endometrium

Thickness: 7.1 mm. No focal abnormality visualized. IUD in
appropriate position within the endometrial cavity. Small amount of
mildly complex fluid distends the endometrial cavity.

Right ovary

Measurements: 5.0 x 4.0 x 4.2 cm = volume: 43.3 mL. 4.0 x 3.5 x
cm complex cyst with internal lace-like architecture, consistent
with a benign hemorrhagic cyst. No internal solid component or
vascularity.

Left ovary

Not visualized.  No adnexal mass.

Pulsed Doppler evaluation of the right ovary demonstrates normal
low-resistance arterial and venous waveforms.

Other findings

No abnormal free fluid.
IMPRESSION: 1. 4 cm complex right ovarian cyst, most consistent with a benign
hemorrhagic cyst. No follow up imaging recommended. Note: This
recommendation does not apply to premenarchal patients or to those
with increased risk (genetic, family history, elevated tumor markers
or other high-risk factors) of ovarian cancer. Reference: Radiology
[DATE]):[id number].
2. Nonvisualization of the left ovary. No other adnexal mass. No
evidence for ovarian torsion or other acute abnormality.
3. IUD in appropriate position within the endometrial cavity.

## 2021-12-05 IMAGING — US US ART/VEN ABD/PELV/SCROTUM DOPPLER LTD
1 series · 13 of 25 positions shown · non-contrast
Comparison: Prior CT from earlier the same day.

CLINICAL DATA: Initial evaluation for acute right adnexal pain,
cyst, evaluate for torsion

EXAM:
TRANSABDOMINAL AND TRANSVAGINAL ULTRASOUND OF PELVIS
DOPPLER ULTRASOUND OF OVARIES
TECHNIQUE: Both transabdominal and transvaginal ultrasound examinations of the
pelvis were performed. Transabdominal technique was performed for
global imaging of the pelvis including uterus, ovaries, adnexal
regions, and pelvic cul-de-sac.
It was necessary to proceed with endovaginal exam following the
transabdominal exam to visualize the uterus, endometrium, and
ovaries. Color and duplex Doppler ultrasound was utilized to
evaluate blood flow to the ovaries.

[Series 1: us art/ven abd/pelv/scrotum doppler ltd · 66 acquisitions, 13 frames shown]
[im 1/66]
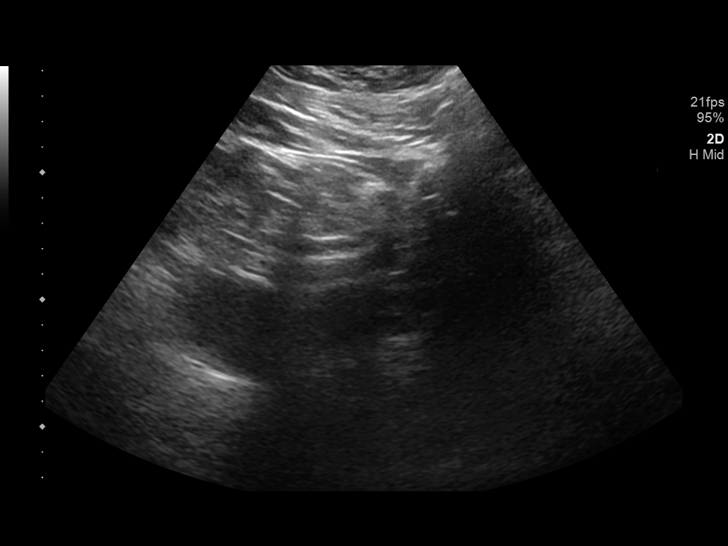
[im 6/66]
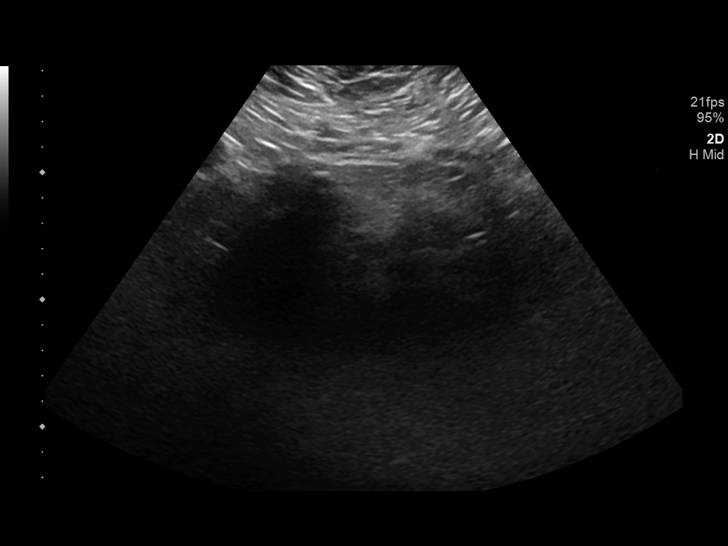
[im 11/66]
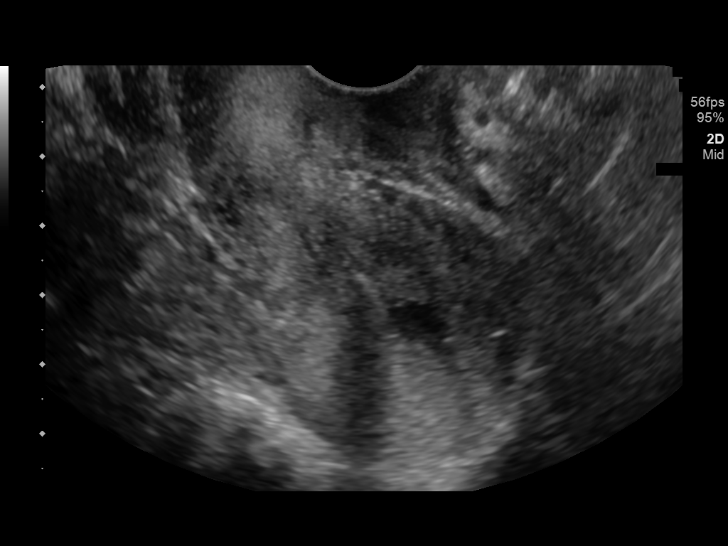
[im 17/66]
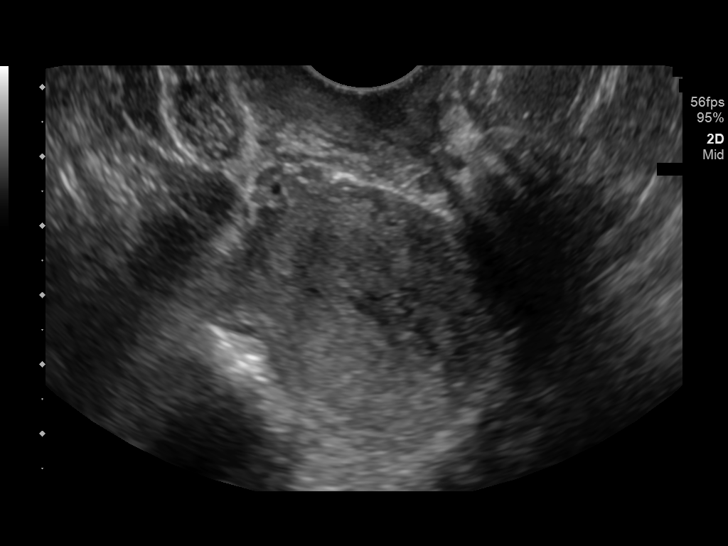
[im 22/66]
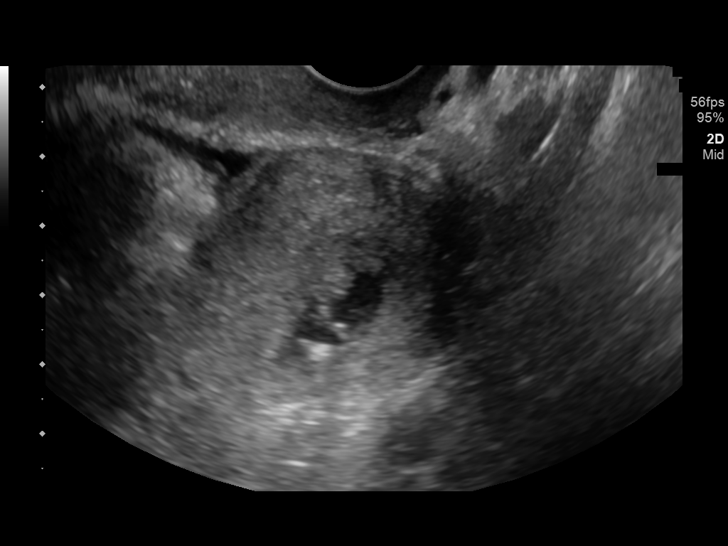
[im 28/66]
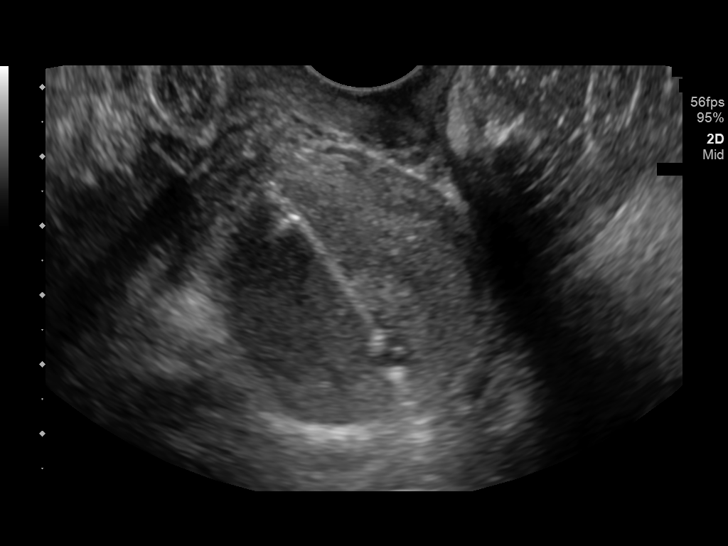
[im 33/66]
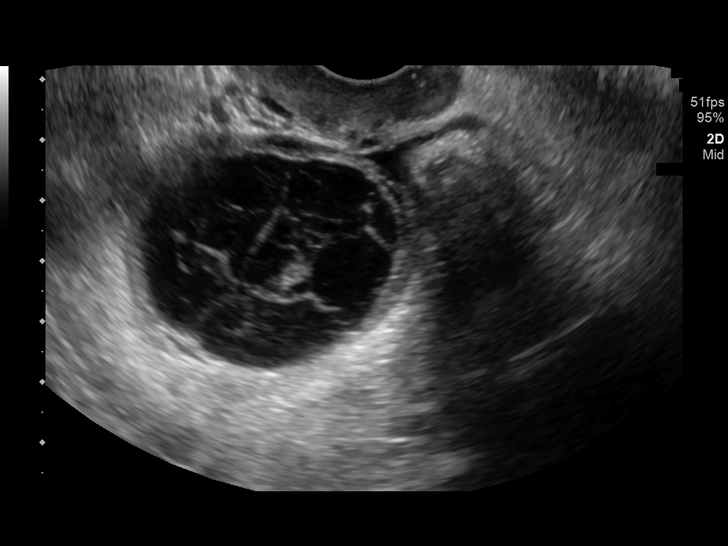
[im 38/66]
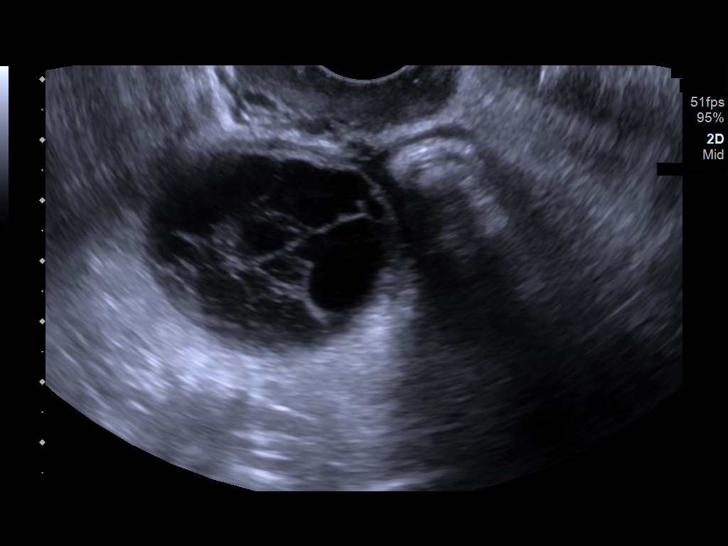
[im 44/66]
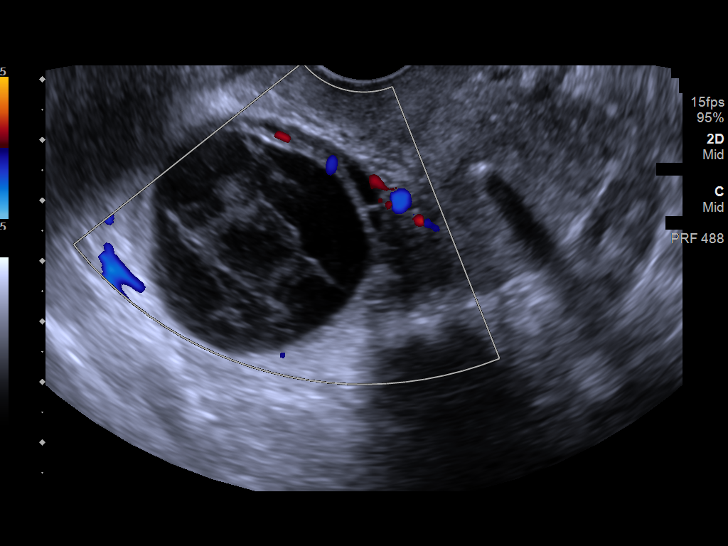
[im 49/66]
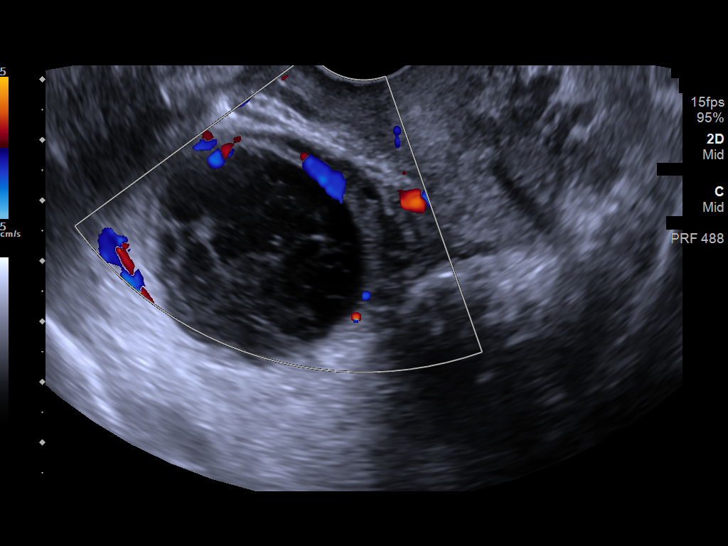
[im 55/66]
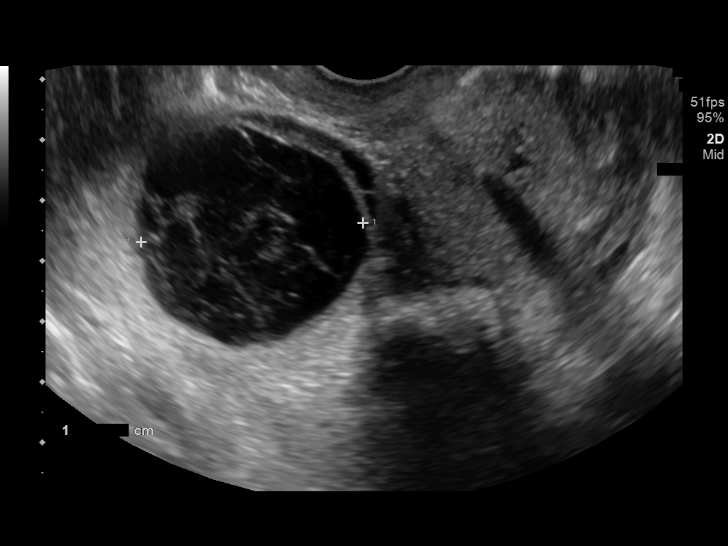
[im 60/66]
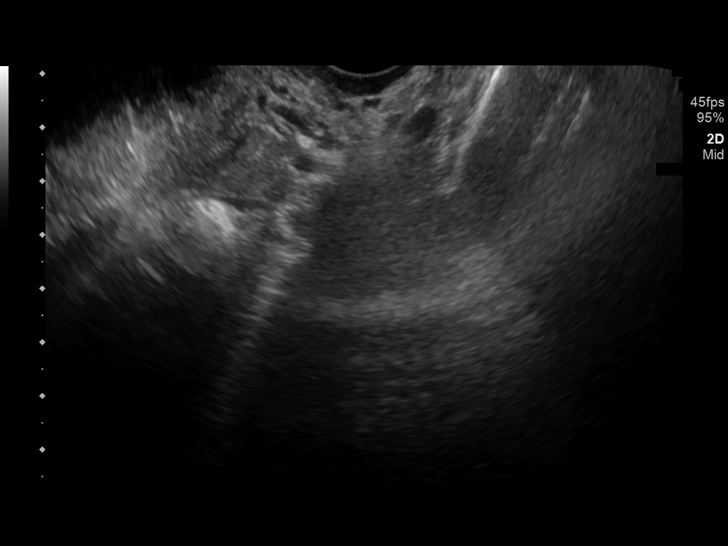
[im 66/66]
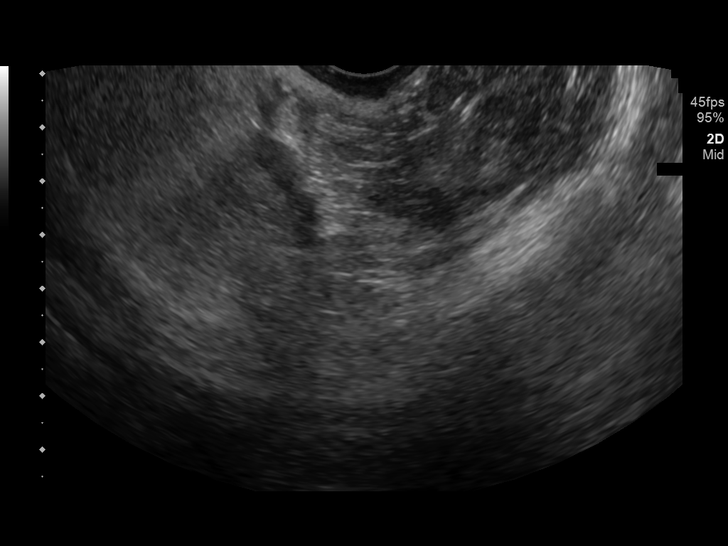

[13 of 25 positions shown; findings below may reference images not displayed]

FINDINGS: Uterus

Measurements: 7.4 x 4.1 x 4.1 cm = volume: 63.8 mL. Uterus is
retroverted. No discrete fibroid or other mass.

Endometrium

Thickness: 7.1 mm. No focal abnormality visualized. IUD in
appropriate position within the endometrial cavity. Small amount of
mildly complex fluid distends the endometrial cavity.

Right ovary

Measurements: 5.0 x 4.0 x 4.2 cm = volume: 43.3 mL. 4.0 x 3.5 x
cm complex cyst with internal lace-like architecture, consistent
with a benign hemorrhagic cyst. No internal solid component or
vascularity.

Left ovary

Not visualized.  No adnexal mass.

Pulsed Doppler evaluation of the right ovary demonstrates normal
low-resistance arterial and venous waveforms.

Other findings

No abnormal free fluid.
IMPRESSION: 1. 4 cm complex right ovarian cyst, most consistent with a benign
hemorrhagic cyst. No follow up imaging recommended. Note: This
recommendation does not apply to premenarchal patients or to those
with increased risk (genetic, family history, elevated tumor markers
or other high-risk factors) of ovarian cancer. Reference: Radiology
[DATE]):[id number].
2. Nonvisualization of the left ovary. No other adnexal mass. No
evidence for ovarian torsion or other acute abnormality.
3. IUD in appropriate position within the endometrial cavity.

## 2022-05-24 ENCOUNTER — Emergency Department (HOSPITAL_COMMUNITY)
Admission: EM | Admit: 2022-05-24 | Discharge: 2022-05-24 | Disposition: A | Discharge status: Home / Self Care | Payer: 59 | Attending: Emergency Medicine | Admitting: Emergency Medicine

## 2022-05-24 ENCOUNTER — Emergency Department (HOSPITAL_COMMUNITY): Payer: 59

## 2022-05-24 ENCOUNTER — Encounter (HOSPITAL_COMMUNITY): Payer: Self-pay | Admitting: Emergency Medicine

## 2022-05-24 DIAGNOSIS — R072 Precordial pain: Secondary | ICD-10-CM | POA: Insufficient documentation

## 2022-05-24 DIAGNOSIS — R1084 Generalized abdominal pain: Secondary | ICD-10-CM | POA: Insufficient documentation

## 2022-05-24 DIAGNOSIS — M791 Myalgia, unspecified site: Secondary | ICD-10-CM | POA: Diagnosis not present

## 2022-05-24 DIAGNOSIS — R110 Nausea: Secondary | ICD-10-CM | POA: Insufficient documentation

## 2022-05-24 DIAGNOSIS — R0781 Pleurodynia: Secondary | ICD-10-CM | POA: Diagnosis present

## 2022-05-24 LAB — TROPONIN I (HIGH SENSITIVITY)
Troponin I (High Sensitivity): 3 ng/L (ref <18)
Troponin I (High Sensitivity): 3 ng/L (ref <18)

## 2022-05-24 LAB — COMPREHENSIVE METABOLIC PANEL
ALT: 25 U/L (ref 0–44)
AST: 19 U/L (ref 15–41)
Albumin: 3.4 g/dL — ABNORMAL LOW (ref 3.5–5.0)
Alkaline Phosphatase: 82 U/L (ref 38–126)
Anion gap: 5 (ref 5–15)
BUN: 11 mg/dL (ref 6–20)
CO2: 26 mmol/L (ref 22–32)
Calcium: 9.2 mg/dL (ref 8.9–10.3)
Chloride: 110 mmol/L (ref 98–111)
Creatinine, Ser: 0.73 mg/dL (ref 0.44–1.00)
GFR, Estimated: >60 mL/min (ref >60)
Glucose, Bld: 88 mg/dL (ref 70–99)
Potassium: 3.9 mmol/L (ref 3.5–5.1)
Sodium: 141 mmol/L (ref 135–145)
Total Bilirubin: 0.3 mg/dL (ref 0.3–1.2)
Total Protein: 7.2 g/dL (ref 6.5–8.1)

## 2022-05-24 LAB — CBC WITH DIFFERENTIAL/PLATELET
Abs Immature Granulocytes: 0.02 10*3/uL (ref 0.00–0.07)
Basophils Absolute: 0 10*3/uL (ref 0.0–0.1)
Basophils Relative: 0 %
Eosinophils Absolute: 0 10*3/uL (ref 0.0–0.5)
Eosinophils Relative: 1 %
HCT: 37.5 % (ref 36.0–46.0)
Hemoglobin: 11.4 g/dL — ABNORMAL LOW (ref 12.0–15.0)
Immature Granulocytes: 0 %
Lymphocytes Relative: 30 %
Lymphs Abs: 1.8 10*3/uL (ref 0.7–4.0)
MCH: 25.4 pg — ABNORMAL LOW (ref 26.0–34.0)
MCHC: 30.4 g/dL (ref 30.0–36.0)
MCV: 83.5 fL (ref 80.0–100.0)
Monocytes Absolute: 0.6 10*3/uL (ref 0.1–1.0)
Monocytes Relative: 9 %
Neutro Abs: 3.7 10*3/uL (ref 1.7–7.7)
Neutrophils Relative %: 60 %
Platelets: 387 10*3/uL (ref 150–400)
RBC: 4.49 MIL/uL (ref 3.87–5.11)
RDW: 14.2 % (ref 11.5–15.5)
WBC: 6.1 10*3/uL (ref 4.0–10.5)
nRBC: 0 % (ref 0.0–0.2)

## 2022-05-24 LAB — URINALYSIS, ROUTINE W REFLEX MICROSCOPIC
Bilirubin Urine: NEGATIVE
Glucose, UA: NEGATIVE mg/dL
Hgb urine dipstick: NEGATIVE
Ketones, ur: NEGATIVE mg/dL
Leukocytes,Ua: NEGATIVE
Nitrite: NEGATIVE
Protein, ur: NEGATIVE mg/dL
Specific Gravity, Urine: 1.021 (ref 1.005–1.030)
pH: 7 (ref 5.0–8.0)

## 2022-05-24 LAB — I-STAT BETA HCG BLOOD, ED (MC, WL, AP ONLY): I-stat hCG, quantitative: <5 m[IU]/mL (ref <5)

## 2022-05-24 LAB — LIPASE, BLOOD: Lipase: 23 U/L (ref 11–51)

## 2022-05-24 LAB — D-DIMER, QUANTITATIVE: D-Dimer, Quant: 0.85 ug/mL-FEU — ABNORMAL HIGH (ref 0.00–0.50)

## 2022-05-24 MED ORDER — NAPROXEN 500 MG PO TABS: 500.0000 mg | ORAL_TABLET | Freq: Two times a day (BID) | ORAL | 0 refills | Status: DC

## 2022-05-24 MED ORDER — IOHEXOL 350 MG/ML SOLN
75.0000 mL | Freq: Once | INTRAVENOUS | Status: AC | PRN
  Administered 2022-05-24: 75 mL via INTRAVENOUS

## 2022-05-24 MED ORDER — ONDANSETRON 4 MG PO TBDP: 4.0000 mg | ORAL_TABLET | Freq: Three times a day (TID) | ORAL | 0 refills | Status: DC | PRN

## 2022-07-06 ENCOUNTER — Encounter (HOSPITAL_BASED_OUTPATIENT_CLINIC_OR_DEPARTMENT_OTHER): Payer: Self-pay

## 2022-07-06 ENCOUNTER — Other Ambulatory Visit: Payer: Self-pay

## 2022-07-06 DIAGNOSIS — S60931A Unspecified superficial injury of right thumb, initial encounter: Secondary | ICD-10-CM | POA: Diagnosis present

## 2022-07-06 DIAGNOSIS — S61011A Laceration without foreign body of right thumb without damage to nail, initial encounter: Secondary | ICD-10-CM | POA: Diagnosis not present

## 2022-07-06 DIAGNOSIS — W268XXA Contact with other sharp object(s), not elsewhere classified, initial encounter: Secondary | ICD-10-CM | POA: Diagnosis not present

## 2022-07-06 NOTE — ED Triage Notes (Signed)
Laceration to rt. Thumb cut with lid of can. Bleeding controlled

## 2022-07-07 ENCOUNTER — Emergency Department (HOSPITAL_BASED_OUTPATIENT_CLINIC_OR_DEPARTMENT_OTHER)
Admission: EM | Admit: 2022-07-07 | Discharge: 2022-07-07 | Disposition: A | Discharge status: Home / Self Care | Payer: 59 | Attending: Emergency Medicine | Admitting: Emergency Medicine

## 2022-07-07 DIAGNOSIS — S61011A Laceration without foreign body of right thumb without damage to nail, initial encounter: Secondary | ICD-10-CM

## 2022-07-07 MED ORDER — LIDOCAINE HCL 2 % IJ SOLN
10.0000 mL | Freq: Once | INTRAMUSCULAR | Status: AC
  Administered 2022-07-07: 200 mg
  Filled 2022-07-07: qty 20

## 2022-07-07 NOTE — Discharge Instructions (Signed)
Local wound care with bacitracin and dressing changes twice daily.  Keep the wound clean and dry.  Sutures are to be removed in 1 week.  Please follow-up with your primary doctor for this.

## 2022-07-07 NOTE — ED Notes (Signed)
Pt verbalizes understanding of discharge instructions. Opportunity for questioning and answers were provided. Pt discharged from ED to home.   ? ?

## 2022-07-07 NOTE — ED Provider Notes (Signed)
[facility] EMERGENCY DEPT Provider Note   CSN: [id number] Arrival date & time: 07/06/22  2333     History  Chief Complaint  Patient presents with   Laceration    Karen Garner is a 26 y.o. female.  Patient presenting here with complaints of a laceration to her right thumb.  She was opening a can when the lid sliced her finger.  Bleeding controlled with direct pressure.  The history is provided by the patient.       Home Medications Prior to Admission medications   Medication Sig Start Date End Date Taking? Authorizing Provider  acetaminophen (TYLENOL) 500 MG tablet Take 1,000 mg by mouth every 6 (six) hours as needed for mild pain, fever or headache.    [provider]  CRANBERRY PO Take 1 tablet by mouth daily.    [provider]  dicyclomine (BENTYL) 20 MG tablet Take 20 mg by mouth 3 (three) times daily as needed for pain. 04/29/21   [provider]  ferrous sulfate 325 (65 FE) MG tablet Take 1 tablet (325 mg total) by mouth daily. Patient not taking: Reported on 05/05/2021 01/06/19   Jacalyn Lefevre, MD  fluticasone [facility]) 50 MCG/ACT nasal spray Place 1-2 sprays into both nostrils daily as needed for allergies or rhinitis.    [provider]  ibuprofen (ADVIL) 800 MG tablet Take 1 tablet (800 mg total) by mouth 3 (three) times daily. Patient not taking: Reported on 05/05/2021 03/01/21   Roxy Horseman, PA-C  naproxen (NAPROSYN) 500 MG tablet Take 1 tablet (500 mg total) by mouth 2 (two) times daily. 05/24/22   Henderly, Britni A, PA-C  ondansetron (ZOFRAN) 8 MG tablet Take 8 mg by mouth every 8 (eight) hours as needed for nausea/vomiting. 04/29/21   [provider]  ondansetron (ZOFRAN-ODT) 4 MG disintegrating tablet Take 1 tablet (4 mg total) by mouth every 8 (eight) hours as needed for nausea or vomiting. 05/24/22   Henderly, Britni A, PA-C  pantoprazole (PROTONIX) 20 MG tablet Take 1 tablet (20 mg total) by mouth daily.  05/05/21   Bethann Berkshire, MD  VITAMIN D PO Take 1 capsule by mouth daily.    [provider]  WEGOVY 2.4 MG/0.75ML SOAJ Inject 2.4 mg into the skin once a week. 04/15/21   [provider]      Allergies    Patient has no known allergies.    Review of Systems   Review of Systems  All other systems reviewed and are negative.   Physical Exam Updated Vital Signs BP 118/67   Pulse 61   Temp 99.2 F (37.3 C) (Oral)   Resp 18   Ht 5\' 3"  (1.6 m)   Wt (!) 147 kg   LMP 06/27/2022 (Exact Date)   SpO2 99%   BMI 57.39 kg/m  Physical Exam Vitals and nursing note reviewed.  Constitutional:      Appearance: Normal appearance.  HENT:     Head: Normocephalic.  Pulmonary:     Effort: Pulmonary effort is normal.  Musculoskeletal:     Comments: There is a 1.5 cm laceration across the finger pad of the right thumb.  Bleeding is controlled.  Skin:    General: Skin is warm and dry.  Neurological:     Mental Status: She is alert.    ED Results / Procedures / Treatments   Labs (all labs ordered are listed, but only abnormal results are displayed) Labs Reviewed - No data to display  EKG  None  Radiology No results found.  Procedures Procedures    Medications Ordered in ED Medications  lidocaine (XYLOCAINE) 2 % (with pres) injection 200 mg (has no administration in time range)    ED Course/ Medical Decision Making/ A&P  Patient presenting with a laceration to the finger pad of her right thumb on the sharp edge of a can.  Laceration repaired as below.  Will discharge with local wound care and suture removal in 1 week.  LACERATION REPAIR Performed by: Geoffery Lyons Authorized by: Geoffery Lyons Consent: Verbal consent obtained. Risks and benefits: risks, benefits and alternatives were discussed Consent given by: patient Patient identity confirmed: provided demographic data Prepped and Draped in normal sterile fashion Wound explored  Laceration Location: Right  thumb  Laceration Length: 1.5 cm cm  No Foreign Bodies seen or palpated  Anesthesia: Digital block  Local anesthetic: lidocaine 2% without epinephrine  Anesthetic total: 5 ml  Irrigation method: syringe Amount of cleaning: standard  Skin closure: 4-0 Ethilon  Number of sutures: 4  Technique: Simple interrupted  Patient tolerance: Patient tolerated the procedure well with no immediate complications.   Final Clinical Impression(s) / ED Diagnoses Final diagnoses:  None    Rx / DC Orders ED Discharge Orders     None         Geoffery Lyons, MD 07/07/22 [phone]

## 2022-12-03 ENCOUNTER — Other Ambulatory Visit (HOSPITAL_COMMUNITY): Payer: Self-pay | Admitting: General Surgery

## 2022-12-10 ENCOUNTER — Ambulatory Visit (HOSPITAL_COMMUNITY): Admission: RE | Admit: 2022-12-10 | Payer: 59 | Source: Ambulatory Visit

## 2022-12-10 ENCOUNTER — Ambulatory Visit (HOSPITAL_COMMUNITY)
Admission: RE | Admit: 2022-12-10 | Discharge: 2022-12-10 | Disposition: A | Discharge status: Home / Self Care | Payer: 59 | Source: Ambulatory Visit | Attending: General Surgery | Admitting: General Surgery

## 2022-12-10 ENCOUNTER — Other Ambulatory Visit: Payer: Self-pay

## 2022-12-20 ENCOUNTER — Encounter: Payer: 59 | Attending: General Surgery | Admitting: Skilled Nursing Facility1

## 2022-12-20 ENCOUNTER — Encounter: Payer: Self-pay | Admitting: Skilled Nursing Facility1

## 2022-12-20 VITALS — Ht 63.0 in | Wt 341.8 lb

## 2022-12-20 DIAGNOSIS — E669 Obesity, unspecified: Secondary | ICD-10-CM | POA: Insufficient documentation

## 2022-12-20 NOTE — Progress Notes (Signed)
Nutrition Assessment for Bariatric Surgery Medical Nutrition Therapy  Patient was seen on 12/20/2022 for Pre-Operative Nutrition Assessment. Letter of approval faxed to [facility] bariatric surgery program coordinator on 12/20/2022.   Referral stated Supervised Weight Loss (SWL) visits needed: 3  Planned surgery: Sleeve Gastrectomy  Pt expectation of surgery: "the be a healthier me" Pt expectation of dietitian: to help me and teach me on my journey     NUTRITION ASSESSMENT   Anthropometrics  Start weight at [facility]: 341.8 lbs (date: 12/20/2022)  Height: 63 in BMI: 60.55 kg/m2     Clinical  Medical hx: N/A Medications: N/A  Labs: vitamin D 15.2, MCV 80.1, MCH 24.8, MCHC 31 Notable signs/symptoms: None stated Any previous deficiencies? No  Micronutrient Nutrition Focused Physical Exam: Hair: No issues observed Eyes: No issues observed Mouth: No issues observed Neck: No issues observed Nails: No issues observed Skin: No issues observed  Lifestyle & Dietary Hx   Pt states she wants to learn about food and wants to be healthier. Pt states she is done with the diet cycling.   Pt states she has some acid reflux after laying down on food.  Pt states she will get headaches if she waits too long to eat.  Pt states she is a stress eater.  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  Pt states she does stress eat on snacks and candy especially when doing homework.   Physical Activity: Walking her dog 3 times a week 30 minutes    Sleep Hygiene: duration and quality:   Current Patient Perceived Stress Level as stated by pt on a scale of 1-10 5      Stress Management Techniques: cleaning the house and music  Fruit servings per week: 0 Non starchy vegetable servings per week: ~1 Whole Grains per week: 1   24-Hr Dietary Recall First Meal: skipped Snack 11: cheesits  Second Meal: eaten out mostly   Snack:  Third Meal: cereal  Snack: snacking through the night: chips, cheeits, fruit roll up etc. Beverages: almond milk, gatorade, water + circl, fruit juice  Alcoholic beverages per week: 3 times a MONTH   Estimated Energy Needs Calories: 1500   NUTRITION DIAGNOSIS  Overweight/obesity ([place]-3.3) related to past poor dietary habits and physical inactivity as evidenced by patient w/ planned sleeve gastrectomy surgery following dietary guidelines for continued weight loss.    NUTRITION INTERVENTION  Nutrition counseling (C-1) and education (E-2) to facilitate bariatric surgery goals.  Educated pt on micronutrient deficiencies post surgery and strategies to mitigate that risk   Pre-Op Goals Reviewed with the Patient Track food and beverage intake (pen and paper, MyFitness Pal, Baritastic app, etc.) Make healthy food choices while monitoring portion sizes Consume 3 meals per day or try to eat every 3-5 hours: eat breakfast  Avoid concentrated sugars and fried foods Keep sugar & fat in the single digits per serving on food labels Practice CHEWING your food (aim for applesauce consistency) Practice not drinking 15 minutes before, during, and 30 minutes after each meal and snack Avoid all carbonated beverages (ex: soda, sparkling beverages)  Limit caffeinated beverages (ex: coffee, tea, energy drinks) Avoid all sugar-sweetened beverages (ex: regular soda, sports drinks)  Avoid alcohol  Aim for 64-100 ounces of FLUID daily (with at least half of fluid intake being plain water): cut out Gatorade Aim for at least 60-80 grams of PROTEIN daily Look for a liquid protein source that contains ?  15 g protein and ?5 g carbohydrate (ex: shakes, drinks, shots) Make a list of non-food related activities Physical activity is an important part of a healthy lifestyle so keep it moving! The goal is to reach 150 minutes of exercise per week, including cardiovascular and weight baring activity.  *Goals  that are bolded indicate the pt would like to start working towards these  Handouts Provided Include  Bariatric Surgery handouts (Nutrition Visits, Pre-Op Goals, Protein Shakes, Vitamins & Minerals)  Learning Style & Readiness for Change Teaching method utilized: Visual & Auditory  Demonstrated degree of understanding via: Teach Back  Readiness Level: contemplative  Barriers to learning/adherence to lifestyle change: none  RD's Notes for Next Visit Relationship with food: stress eating for next visit Making meals form home next visit     MONITORING & EVALUATION Dietary intake, weekly physical activity, body weight, and pre-op goals reached at next nutrition visit.    Next Steps  Patient is to follow up at [facility] for Pre-Op Class >2 weeks before surgery for further nutrition education.

## 2022-12-23 NOTE — Progress Notes (Signed)
Cardiology Office Note:   Date:  12/24/2022  NAME:  Karen Garner    MRN: [id number] DOB:  [birth year]   PCP:  Theodosia Blender, PA-C  Cardiologist:  None  Electrophysiologist:  None   Referring MD: Greer Pickerel, MD   Chief Complaint  Patient presents with   Pre-op Exam        History of Present Illness:   Karen Garner is a 27 y.o. female with a hx of morbid obesity who is being seen today for the evaluation of preoperative assessment at the request of Greer Pickerel, MD. recently seen by bariatric surgery.  She is planned to undergo gastric sleeve placement.  Her BMI is 59.  Concerns for murmur by surgery.  She does have a faint flow murmur.  We discussed getting an echocardiogram to check this out.  Her EKG is normal.  She reports no chest pain or trouble breathing.  She is walking 1 mile 3 times per week without limitations.  She is not diabetic.  No high blood pressure.  She has never had a heart attack or stroke personally.  She does have high blood pressure and diabetes in the family.  Her exam is otherwise unremarkable.  No signs of heart failure.  She does not smoke tobacco products.  No alcohol use.  She does smoke marijuana occasionally.  She works as a Publishing rights manager.  Denies any major symptoms in office.  Problem List Morbid obesity -BMI 59  Past Medical History: History reviewed. No pertinent past medical history.  Past Surgical History: Past Surgical History:  Procedure Laterality Date   IUD REMOVAL     TONSILLECTOMY      Current Medications: Current Meds  Medication Sig   CRANBERRY PO Take 1 tablet by mouth daily.   VITAMIN D PO Take 1 capsule by mouth daily.     Allergies:    Patient has no known allergies.   Social History: Social History   Socioeconomic History   Marital status: Single    Spouse name: Not on file   Number of children: 0   Years of education: Not on file   Highest education level: Not on file  Occupational History   Occupation:  Bailbondsman  Tobacco Use   Smoking status: Never   Smokeless tobacco: Never  Vaping Use   Vaping Use: Never used  Substance and Sexual Activity   Alcohol use: Yes    Comment: socially   Drug use: Yes    Types: Marijuana   Sexual activity: Not on file  Other Topics Concern   Not on file  Social History Narrative   Not on file   Social Determinants of Health   Financial Resource Strain: Not on file  Food Insecurity: Not on file  Transportation Needs: Not on file  Physical Activity: Not on file  Stress: Not on file  Social Connections: Not on file     Family History: The patient's family history includes Hypertension in her father.  ROS:   All other ROS reviewed and negative. Pertinent positives noted in the HPI.     EKGs/Labs/Other Studies Reviewed:   The following studies were personally reviewed by me today:  EKG:  EKG is ordered today.  The ekg ordered today demonstrates normal sinus rhythm heart rate 75, no acute ischemic changes or evidence of infarction, and was personally reviewed by me.   Recent Labs: 05/24/2022: ALT 25; BUN 11; Creatinine, Ser 0.73; Hemoglobin 11.4; Platelets 387; Potassium 3.9; Sodium 141  Recent Lipid Panel No results found for: "CHOL", "TRIG", "HDL", "CHOLHDL", "VLDL", "LDLCALC", "LDLDIRECT"  Physical Exam:   VS:  BP 122/80   Pulse 75   Ht 5\' 3"  (1.6 m)   Wt (!) 337 lb 3.2 oz (153 kg)   LMP 12/10/2022 Comment: patient signed preg test waiver  BMI 59.73 kg/m    Wt Readings from Last 3 Encounters:  12/24/22 (!) 337 lb 3.2 oz (153 kg)  12/20/22 (!) 341 lb 12.8 oz (155 kg)  07/06/22 (!) 324 lb (147 kg)    General: Well nourished, well developed, in no acute distress Head: Atraumatic, normal size  Eyes: PEERLA, EOMI  Neck: Supple, no JVD Endocrine: No thryomegaly Cardiac: Normal S1, S2; RRR; 2 out of 6 systolic ejection murmur Lungs: Clear to auscultation bilaterally, no wheezing, rhonchi or rales  Abd: Soft, nontender, no  hepatomegaly  Ext: No edema, pulses 2+ Musculoskeletal: No deformities, BUE and BLE strength normal and equal Skin: Warm and dry, no rashes   Neuro: Alert and oriented to person, place, time, and situation, CNII-XII grossly intact, no focal deficits  Psych: Normal mood and affect   ASSESSMENT:   Karen Garner is a 27 y.o. female who presents for the following: 1. Preop cardiovascular exam   2. Obesity, morbid, BMI 50 or higher ([place])   3. Murmur, cardiac     PLAN:   1. Preop cardiovascular exam 2. Obesity, morbid, BMI 50 or higher ([place]) 3. Murmur, cardiac -Faint murmur on examination today.  We will set her up for an echocardiogram.  Her EKG is normal.  She can walk up to 1 mile without chest pain or trouble breathing.  She can complete greater than 4 METS.  She can proceed to surgery as long as her echo is normal.  I will get in touch with her by phone.  She will see [place] back as needed based on the results of testing.  We will notify her surgeon of the results of her echo and let her proceed to surgery. -The Revised Cardiac Risk Index = 0(high risk surgery (intraperitoneal, intrathoracic, or suprainguinal vascular), CAD, CHF, CVA, DM on insulin, Scr >2), which equates to 0.4 (0=0.4%: very low risk; 1=0.9%: low risk; 2=6.6%: moderate risk; >2=>11%; high risk) estimated risk of perioperative myocardial infarction, pulmonary edema, ventricular fibrillation, cardiac arrest, or complete heart block.  -Due to symptoms of murmur, an echocardiogram is recommended prior to proceeding with their planned procedure.  No stress test needed.  Disposition: Return if symptoms worsen or fail to improve.  Medication Adjustments/Labs and Tests Ordered: Current medicines are reviewed at length with the patient today.  Concerns regarding medicines are outlined above.  Orders Placed This Encounter  Procedures   EKG 12-Lead   ECHOCARDIOGRAM COMPLETE   No orders of the defined types were placed in this  encounter.   Patient Instructions  Medication Instructions:  The current medical regimen is effective;  continue present plan and medications.  *If you need a refill on your cardiac medications before your next appointment, please call your pharmacy*   Testing/Procedures: Echocardiogram - Your physician has requested that you have an echocardiogram. Echocardiography is a painless test that uses sound waves to create images of your heart. It provides your doctor with information about the size and shape of your heart and how well your heart's chambers and valves are working. This procedure takes approximately one hour. There are no restrictions for this procedure.     Follow-Up: At [facility], you and your health needs are our priority.  As part of our continuing mission to provide you with exceptional heart care, we have created designated Provider Care Teams.  These Care Teams include your primary Cardiologist (physician) and Advanced Practice Providers (APPs -  Physician Assistants and Nurse Practitioners) who all work together to provide you with the care you need, when you need it.  We recommend signing up for the patient portal called "MyChart".  Sign up information is provided on this After Visit Summary.  MyChart is used to connect with patients for Virtual Visits (Telemedicine).  Patients are able to view lab/test results, encounter notes, upcoming appointments, etc.  Non-urgent messages can be sent to your provider as well.   To learn more about what you can do with MyChart, go to [email or web address].    Your next appointment:   As needed  Provider:   Lennie Odor, MD    Signed, Lenna Gilford. Flora Lipps, MD, [facility]  [facility]  [address] [place], [place] [address] [phone]  12/24/2022 10:05 AM

## 2022-12-24 ENCOUNTER — Encounter: Payer: Self-pay | Admitting: Cardiovascular Disease

## 2022-12-24 ENCOUNTER — Ambulatory Visit: Payer: 59 | Attending: Cardiovascular Disease | Admitting: Cardiovascular Disease

## 2022-12-24 VITALS — BP 122/80 | HR 75 | Ht 63.0 in | Wt 337.2 lb

## 2022-12-24 DIAGNOSIS — Z01810 Encounter for preprocedural cardiovascular examination: Secondary | ICD-10-CM | POA: Diagnosis not present

## 2022-12-24 DIAGNOSIS — R011 Cardiac murmur, unspecified: Secondary | ICD-10-CM

## 2022-12-24 NOTE — Patient Instructions (Signed)
Medication Instructions:  The current medical regimen is effective;  continue present plan and medications.  *If you need a refill on your cardiac medications before your next appointment, please call your pharmacy*   Testing/Procedures:  Echocardiogram - Your physician has requested that you have an echocardiogram. Echocardiography is a painless test that uses sound waves to create images of your heart. It provides your doctor with information about the size and shape of your heart and how well your heart's chambers and valves are working. This procedure takes approximately one hour. There are no restrictions for this procedure.     Follow-Up: At [facility], you and your health needs are our priority.  As part of our continuing mission to provide you with exceptional heart care, we have created designated Provider Care Teams.  These Care Teams include your primary Cardiologist (physician) and Advanced Practice Providers (APPs -  Physician Assistants and Nurse Practitioners) who all work together to provide you with the care you need, when you need it.  We recommend signing up for the patient portal called "MyChart".  Sign up information is provided on this After Visit Summary.  MyChart is used to connect with patients for Virtual Visits (Telemedicine).  Patients are able to view lab/test results, encounter notes, upcoming appointments, etc.  Non-urgent messages can be sent to your provider as well.   To learn more about what you can do with MyChart, go to [email or web address].    Your next appointment:   As needed  Provider:   Mapleton O'Neal, MD   

## 2023-01-06 ENCOUNTER — Ambulatory Visit (INDEPENDENT_AMBULATORY_CARE_PROVIDER_SITE_OTHER): Payer: 59 | Admitting: Licensed Clinical Social Worker

## 2023-01-06 DIAGNOSIS — F4320 Adjustment disorder, unspecified: Secondary | ICD-10-CM | POA: Diagnosis not present

## 2023-01-07 NOTE — Progress Notes (Signed)
Comprehensive Clinical Assessment (CCA) Note  01/07/2023 Karen Garner [id number]  Chief Complaint:  Chief Complaint  Patient presents with   Obesity   Visit Diagnosis: Adjustment disorder, unspecified type      CCA Biopsychosocial Intake/Chief Complaint:  Bariatric  Current Symptoms/Problems: No clinically significant anxiety present, lower back pain, knee pain, weight has flucuated about 20 lbs over the last 6 months,  typical stressors that everyone deals with, No SI, No Psyhosis   Patient Reported Schizophrenia/Schizoaffective Diagnosis in Past: No   Strengths: smart, jack of all trades, Technical sales engineer, optimistic, good at communication  Preferences: prefers being outdoors, prefers being with friends, prefers being around family,  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[employer]),   Type of Services Patient Feels are Needed: Bariatric   Initial Clinical Notes/Concerns: History of obesity: Weight increased after highschool,   Family history of obesity: Paternal side of the is heavy and have diabetes,   Weight loss attempts: intermittent fasting, slim fasting, topamax, phetramine, wegovy   Current diet: trying to eat 3 times a day, increasing water intake,   Co-morbid diagnosis: None   Previous procedures: Tonsils removed age 22, recovered well   Mental Health Symptoms Depression:  None   Duration of Depressive symptoms: No data recorded  Mania:  None   Anxiety:   None   Psychosis:  None   Duration of Psychotic symptoms: No data recorded  Trauma:  None   Obsessions:  None   Compulsions:  None   Inattention:  None   Hyperactivity/Impulsivity:  None   Oppositional/Defiant Behaviors:  None   Emotional Irregularity:  None   Other Mood/Personality Symptoms:   None    Mental Status Exam Appearance and self-care  Stature:  Average   Weight:  Obese   Clothing:  Casual   Grooming:  Well-groomed   Cosmetic use:  Age appropriate   Posture/gait:   Normal   Motor activity:  Not Remarkable   Sensorium  Attention:  Normal   Concentration:  Normal   Orientation:  X5   Recall/memory:  Normal   Affect and Mood  Affect:  Appropriate   Mood:  Euthymic   Relating  Eye contact:  Normal   Facial expression:  Responsive   Attitude toward examiner:  Cooperative   Thought and Language  Speech flow: Normal   Thought content:  Appropriate to Mood and Circumstances   Preoccupation:  None   Hallucinations:  None   Organization:  No data recorded  [employer] of Knowledge:  Good   Intelligence:  Average   Abstraction:  Normal   Judgement:  Good   Reality Testing:  Adequate   Insight:  Good   Decision Making:  Normal   Social Functioning  Social Maturity:  Responsible   Social Judgement:  Normal   Stress  Stressors:  No data recorded  Coping Ability:  Normal   Skill Deficits:  None   Supports:  Family; Church     Religion: Religion/Spirituality Are You A Religious Person?: Yes What is Your Religious Affiliation?: Christian How Might This Affect Treatment?: Support in treatment  Leisure/Recreation: Leisure / Recreation Do You Have Hobbies?: Yes Leisure and Hobbies: Spend time in family  Exercise/Diet: Exercise/Diet Do You Exercise?: Yes What Type of Exercise Do You Do?: Run/Walk How Many Times a Week Do You Exercise?: 1-3 times a week Have You Gained or Lost A Significant Amount of Weight in the Past Six Months?:  (Weight has flucated 20 lbs) Do You Follow  a Special Diet?: Yes Type of Diet: See above Do You Have Any Trouble Sleeping?: No   CCA Employment/Education Employment/Work Situation: Employment / Work Situation Employment Situation: Employed Where is Patient Currently Employed?: [place] has Patient Been Employed?: 1 year Are You Satisfied With Your Job?: Yes 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: Business Did You Have Any Special Interests In School?: math Did You Have An Individualized Education Program (IIEP): No Did You Have Any Difficulty At School?: No Patient's Education Has Been Impacted by Current Illness: No   CCA Family/Childhood History Family and Relationship History: Family history Marital status: Single Are you sexually active?: Yes What is your sexual orientation?: Heterosexual Has your sexual activity been affected by drugs, alcohol, medication, or emotional stress?: None Does patient have children?: No  Childhood History:  Childhood History By whom was/is the patient raised?: Mother/father and step-parent Additional childhood history information: Mother and Stepfather were in the home. Father was in her life but not very active. Patient describes childhood "as good." Description of patient's relationship with caregiver when they were a child: Mother: good, Stepfather: good, Father: limited Patient's description of current relationship with people who raised him/her: Mother: good, Stepfather: deceased, Father: deceased How were you disciplined when you got in trouble as a child/adolescent?: Spanked Does patient have siblings?: Yes Number of Siblings: 7 Description of patient's current relationship with siblings: 4 brothers, 3 sisters: good but doesn't talk to one sister on her father's  side Did patient suffer any verbal/emotional/physical/sexual abuse as a child?: No Did patient suffer from severe childhood neglect?: No Has patient ever been sexually abused/assaulted/raped as an adolescent or adult?: No Was the patient ever a victim of a crime or a disaster?: No Witnessed domestic violence?: No Has patient been affected by domestic violence as an adult?: No  Child/Adolescent Assessment:     CCA Substance Use Alcohol/Drug Use: Alcohol / Drug Use Pain Medications: See patient MAR Prescriptions: See patient MAR Over the Counter: See patient MAR History of alcohol / drug use?: No history of alcohol / drug abuse                         ASAM's:  Six Dimensions of Multidimensional Assessment  Dimension 1:  Acute Intoxication and/or Withdrawal Potential:   Dimension 1:  Description of individual's past and current experiences of substance use and withdrawal: None  Dimension 2:  Biomedical Conditions and Complications:   Dimension 2:  Description of patient's biomedical conditions and  complications: None  Dimension 3:  Emotional, Behavioral, or Cognitive Conditions and Complications:  Dimension 3:  Description of emotional, behavioral, or cognitive conditions and complications: None  Dimension 4:  Readiness to Change:  Dimension 4:  Description of Readiness to Change criteria: None  Dimension 5:  Relapse, Continued use, or Continued Problem Potential:  Dimension 5:  Relapse,  continued use, or continued problem potential critiera description: None  Dimension 6:  Recovery/Living Environment:  Dimension 6:  Recovery/Iiving environment criteria description: None  ASAM Severity Score: ASAM's Severity Rating Score: 0  ASAM Recommended Level of Treatment:     Substance use Disorder (SUD)    Recommendations for Services/Supports/Treatments: Recommendations for Services/Supports/Treatments Recommendations For Services/Supports/Treatments: Other (Comment)  (Bariatric)  DSM5 Diagnoses: There are no problems to display for this patient.   Patient Centered Plan: Patient is on the following Treatment Plan(s):  No treatment plan needed.  Behavioral Health Assessment Patient Name Karen Garner Date of Birth [birth year]  Age 61 Date of Interview 02.08.2024  Gender Female Date of Report 02.09.2024  Purpose Bariatric/Weight-loss Surgery (pre-operative evaluation)     Assessment Instruments:  DSM-5-TR Self-Rated Level 1 Cross-Cutting Symptom Measure--Adult Severity Measure for Generalized Anxiety Disorder--Adult EAT-26  Chief Complain: Obesity  Client Background: Patient is a 27 year old African American female seeking weight loss surgery. Patient has a bachelor's degree in Business and works at [employer]  Patient is single.. The patient is 5 feet 3 inches tall and 337 lbs., placing her at a BMI of 59.7 classifying her in the obese range and at further risk of co-morbid diseases.  Weight History:  Patient's weight started to really increase after she graduated highschool. Patient has tried intermittent fasting, slim fast, topamax, phetramine, and wegovy. She has had limited success.   Eating Patterns:  Patient is trying to eat 3 times a day, and increasing her water intake.  Related Medical Issues:   Patient has no co-morbid diagnosis.  Family History of Obesity:  Patient's father's side of the family is heavy and have diabetes.   Tobacco Use: Patient denies tobacco use.   PATIENT BEHAVIORAL ASSESSMENT SCORES  Personal History of Mental Illness: Patient denies treatment for depression and anxiety. She saw a counselor as a child.   Mental Status Examination: Patient was oriented x5 (person, place, situation, time, and object). she was appropriately groomed, and neatly dressed. Patient was alert, engaged, pleasant, and cooperative. Patient denies suicidal and homicidal ideations. Patient denies self-injury. Patient denies  psychosis including auditory and visual hallucinations  DSM-5-TR Self-Rated Level 1 Cross-Cutting Symptom Measure--Adult:  Patient rated herself a 0 on the Depressive and Anxiety Domains.   Severity Measure for Generalized Anxiety Disorder--Adult: Patient completed a 10-question scale. Total scores can range from 0 to 40. A raw score is calculated by summing the answer to each question, and an average total score is achieved by dividing the raw score by the number of items (e.g., 10). Patient had a total raw score of 0 out of 40 which was divided by the total number of questions answered (10) to get an average score of 0 which indicates no significant anxiety.   EAT-26: The EAT-26 is a twenty-six-question screening tool to identify symptoms of eating disorders and disordered eating. The patient scored 0 out of 26. Scores below a 20 are considered not meeting criteria for disordered eating. Patient denies inducing vomiting, or intentional meal skipping. Patient denies binge eating behaviors. Patient denies laxative abuse. Patient does not meet criteria for a DSM-V eating disorder.  Conclusion & Recommendations:   Mauri Lewman's health history and current assessment indicate that she is suitable for bariatric surgery. Patient understands the procedure, the risks associated with it, and the importance of post-operative holistic care (Physical, Spiritual/Values, Relationships, and Mental/Emotional health) with access to resources for support as needed. The patient has made an informed decision  to proceed with the procedure. The patient is motivated and expressed understanding of the post-surgical requirements. Patient's psychological assessment will be valid from today's date for 6 months (08.08.2024). Then, a follow-up appointment will be needed to re-evaluate the patient's psychological status.   I see no significant psychological factors that would hinder the success of bariatric surgery. I support Henri Kaz desire for Bariatric Surgery.   Glori Bickers, LCSW     Referrals to Alternative Service(s): Referred to Alternative Service(s):   Place:   Date:   Time:    Referred to Alternative Service(s):   Place:   Date:   Time:    Referred to Alternative Service(s):   Place:   Date:   Time:    Referred to Alternative Service(s):   Place:   Date:   Time:      Collaboration of Care: Other provider involved in patient's care [facility]  Patient/Guardian was advised Release of Information must be obtained prior to any record release in order to collaborate their care with an outside provider. Patient/Guardian was advised if they have not already done so to contact the registration department to sign all necessary forms in order for [place] to release information regarding their care.   Consent: Patient/Guardian gives verbal consent for treatment and assignment of benefits for services provided during this visit. Patient/Guardian expressed understanding and agreed to proceed.   Glori Bickers, LCSW

## 2023-01-11 ENCOUNTER — Encounter: Payer: Self-pay | Admitting: Skilled Nursing Facility1

## 2023-01-11 ENCOUNTER — Encounter: Payer: 59 | Attending: General Surgery | Admitting: Skilled Nursing Facility1

## 2023-01-11 VITALS — Ht 63.0 in | Wt 345.5 lb

## 2023-01-11 DIAGNOSIS — E669 Obesity, unspecified: Secondary | ICD-10-CM | POA: Insufficient documentation

## 2023-01-11 NOTE — Progress Notes (Signed)
Nutrition Assessment for Bariatric Surgery Medical Nutrition Therapy  Patient was seen on 12/20/2022 for Pre-Operative Nutrition Assessment. Letter of approval faxed to [facility] bariatric surgery program coordinator on 12/20/2022.   Referral stated Supervised Weight Loss (SWL) visits needed: 3  Planned surgery: Sleeve Gastrectomy  Pt expectation of surgery: "the be a healthier me" Pt expectation of dietitian: to help me and teach me on my journey     NUTRITION ASSESSMENT   Anthropometrics  Start weight at [facility]: 341.8 lbs (date: 12/20/2022)  Height: 63 in BMI: 61.20 kg/m2     Clinical  Medical hx: N/A Medications: N/A  Labs: vitamin D 15.2, MCV 80.1, MCH 24.8, MCHC 31 Notable signs/symptoms: None stated Any previous deficiencies? No  Micronutrient Nutrition Focused Physical Exam: Hair: No issues observed Eyes: No issues observed Mouth: No issues observed Neck: No issues observed Nails: No issues observed Skin: No issues observed  Lifestyle & Dietary Hx  Pt states she has been trying to eat breakfast every day.  Pt states she cut out Gatorade and drinking more water.   Physical Activity: Walking her dog 3 times a week 30 minutes    Sleep Hygiene: duration and quality:   Current Patient Perceived Stress Level as stated by pt on a scale of 1-10 5      Stress Management Techniques: cleaning the house and music  Fruit servings per week: 0 Non starchy vegetable servings per week: ~1 Whole Grains per week: 1   24-Hr Dietary Recall First Meal: chic fila grilled or eggs or skipped Snack 11: cheesits or chips Second Meal: eaten out mostly or grilled cheese and fruit snacks Snack: nutty buddy Third Meal: cereal  Snack: snacking through the night: chips, cheeits, fruit roll up etc. Beverages: almond milk, gatorade, water + circl, fruit juice, snacks  Alcoholic beverages per week: 3 times a MONTH   Estimated Energy Needs Calories: 1500   NUTRITION  DIAGNOSIS  Overweight/obesity ([place]-3.3) related to past poor dietary habits and physical inactivity as evidenced by patient w/ planned sleeve gastrectomy surgery following dietary guidelines for continued weight loss.    NUTRITION INTERVENTION  Nutrition counseling (C-1) and education (E-2) to facilitate bariatric surgery goals.  Educated pt on micronutrient deficiencies post surgery and strategies to mitigate that risk  Goals: NEW: Go grocery shopping every other week Continue: Aim for 64-100 ounces of FLUID daily (with at least half of fluid intake being plain water): cut out Gatorade Continue: Consume 3 meals per day or try to eat every 3-5 hours: eat breakfast: 3 actual meals   *Goals that are bolded indicate the pt would like to start working towards these  Handouts Provided Include  Bariatric Surgery handouts (Nutrition Visits, Pre-Op Goals, Protein Shakes, Vitamins & Minerals)  Learning Style & Readiness for Change Teaching method utilized: Visual & Auditory  Demonstrated degree of understanding via: Teach Back  Readiness Level: contemplative  Barriers to learning/adherence to lifestyle change: none  RD's Notes for Next Visit Relationship with food: stress eating for next visit Continued: Making meals form home next visit     MONITORING & EVALUATION Dietary intake, weekly physical activity, body weight, and pre-op goals reached at next nutrition visit.    Next Steps  Patient is to follow up at [facility] for Pre-Op Class >2 weeks before surgery for further nutrition education.

## 2023-01-18 ENCOUNTER — Ambulatory Visit (HOSPITAL_COMMUNITY): Payer: 59 | Attending: Cardiovascular Disease

## 2023-01-18 DIAGNOSIS — R011 Cardiac murmur, unspecified: Secondary | ICD-10-CM

## 2023-01-18 LAB — ECHOCARDIOGRAM COMPLETE
Area-P 1/2: 4.31 cm2
MV M vel: 5.05 m/s
MV Peak grad: 102 mmHg
Radius: 0.6 cm
S' Lateral: 3.2 cm

## 2023-02-09 ENCOUNTER — Ambulatory Visit: Payer: 59 | Admitting: Skilled Nursing Facility1

## 2023-02-10 ENCOUNTER — Encounter: Payer: 59 | Attending: General Surgery | Admitting: Skilled Nursing Facility1

## 2023-02-10 ENCOUNTER — Encounter: Payer: Self-pay | Admitting: Skilled Nursing Facility1

## 2023-02-10 VITALS — Ht 63.0 in | Wt 345.5 lb

## 2023-02-10 DIAGNOSIS — E669 Obesity, unspecified: Secondary | ICD-10-CM | POA: Insufficient documentation

## 2023-02-10 NOTE — Progress Notes (Signed)
Bariatric Surgery: Pre-surgery Nutrition Program  Medical Nutrition Therapy   Referral stated Supervised Weight Loss (SWL) visits needed: 3  SWL visits 2 of 3  Planned surgery: Sleeve Gastrectomy  Pt expectation of surgery: "the be a healthier me" Pt expectation of dietitian: to help me and teach me on my journey     NUTRITION ASSESSMENT   Anthropometrics  Start weight at [facility]: 341.8 lbs (date: 12/20/2022)  Weight 345.5 pounds BMI: 61.20 kg/m2     Clinical  Medical hx: N/A Medications: N/A  Labs: vitamin D 15.2, MCV 80.1, MCH 24.8, MCHC 31 Notable signs/symptoms: None stated Any previous deficiencies? No  Micronutrient Nutrition Focused Physical Exam: Hair: No issues observed Eyes: No issues observed Mouth: No issues observed Neck: No issues observed Nails: No issues observed Skin: No issues observed  Lifestyle & Dietary Hx   Pt states she is tired of going to grocery store stating she goes every day because she did not want to buy too much at once for fear she would not eat it stating she thinks she is at the point where she will increase how much she buys at the store to go less often but still make meals from home.   Pt states she has not been snacking at night as much stating the time change has her tired and is going to bed.  Pt states she finds herself making healthier decisions and has not drank any soda.    Physical Activity: Walking her dog 3 times a week 30 minutes    Sleep Hygiene: duration and quality:   Current Patient Perceived Stress Level as stated by pt on a scale of 1-10 5      Stress Management Techniques: cleaning the house and music  Fruit servings per week: 0 Non starchy vegetable servings per week: 1 Whole Grains per week: 2 (eating wheat thins now)    24-Hr Dietary Recall First Meal: chic fila grilled chicken and egg  Snack 11: chic fila yogurt parfait + extra granola Second Meal: eaten leftovers Snack: cheetoes  Third Meal: baked  chicken + asparagus + mozzarella cheese on it and mashed potatoes Snack: carton of juice Beverages: almond milk, gatorade, water + circl, fruit juice  Alcoholic beverages per week: 3 times a MONTH   Estimated Energy Needs Calories: 1500   NUTRITION DIAGNOSIS  Overweight/obesity ([place]-3.3) related to past poor dietary habits and physical inactivity as evidenced by patient w/ planned sleeve gastrectomy surgery following dietary guidelines for continued weight loss.    NUTRITION INTERVENTION  Nutrition counseling (C-1) and education (E-2) to facilitate bariatric surgery goals.  Educated pt on micronutrient deficiencies post surgery and strategies to mitigate that risk  Goals: continue: Go grocery shopping every other week Continue: Aim for 64-100 ounces of FLUID daily (with at least half of fluid intake being plain water): cut out Gatorade Continue: Consume 3 meals per day or try to eat every 3-5 hours: eat breakfast: 3 actual meals  NEW: More veggies NEW: try humus  NEW: continue to cut back on snacking type foods  NEW: add in a fourth day of walking king    Handouts Provided Include  Grocery shopping list  Learning Style & Readiness for Change Teaching method utilized: Visual & Auditory  Demonstrated degree of understanding via: Teach Back  Readiness Level: preparation  Barriers to learning/adherence to lifestyle change: none  RD's Notes for Next Visit Relationship with food: stress eating for next visit Continued: Making meals form home next visit  MONITORING & EVALUATION Dietary intake, weekly physical activity, body weight, and pre-op goals reached at next nutrition visit.    Next Steps  Patient is to follow up at [facility] for next SWL visit

## 2023-03-10 ENCOUNTER — Ambulatory Visit: Payer: 59 | Admitting: Skilled Nursing Facility1

## 2023-03-24 ENCOUNTER — Encounter: Payer: 59 | Attending: General Surgery | Admitting: Dietician

## 2023-03-24 ENCOUNTER — Encounter: Payer: Self-pay | Admitting: Dietician

## 2023-03-24 ENCOUNTER — Ambulatory Visit: Payer: Medicaid Other | Admitting: Skilled Nursing Facility1

## 2023-03-24 VITALS — Ht 63.0 in | Wt 347.3 lb

## 2023-03-24 DIAGNOSIS — E669 Obesity, unspecified: Secondary | ICD-10-CM | POA: Diagnosis present

## 2023-03-24 NOTE — Progress Notes (Signed)
Bariatric Surgery: Pre-surgery Nutrition Program  Medical Nutrition Therapy  Referral stated Supervised Weight Loss (SWL) visits needed: 3  SWL visits 3 of 3  Pt completed visits.   Pt has cleared nutrition requirements.   Planned surgery: Sleeve Gastrectomy  Pt expectation of surgery: "the be a healthier me" Pt expectation of dietitian: to help me and teach me on my journey    NUTRITION ASSESSMENT   Anthropometrics  Start weight at [facility]: 341.8 lbs (date: 12/20/2022)  Weight: 347.3 pounds BMI: 61.52 kg/m2     Clinical  Medical hx: N/A Medications: N/A  Labs: vitamin D 15.2, MCV 80.1, MCH 24.8, MCHC 31 Notable signs/symptoms: None stated Any previous deficiencies? No  Micronutrient Nutrition Focused Physical Exam: Hair: No issues observed Eyes: No issues observed Mouth: No issues observed Neck: No issues observed Nails: No issues observed Skin: No issues observed  Lifestyle & Dietary Hx  Pt states she is taking Vit D daily. Pt states she tried humus, stating she did not like it. Pt states she she is doing better with meal planning, food shopping and meal prepping. Pt states she wants to try to lose at least 100 lbs, to be a solid 200 pounds. Pt states she gets about 64 ounces of fluids a day. Pt states she goes 2 miles with her dog now. Pt states she does not snack at night anymore, stating she will get a zero calorie water/beverage.  Physical Activity: Walking her dog 3 times a week 30 minutes   Sleep Hygiene: duration and quality:   Current Patient Perceived Stress Level as stated by pt on a scale of 1-10 5      Stress Management Techniques: cleaning the house and music  Fruit servings per week: 0 Non starchy vegetable servings per week: 1 Whole Grains per week: 2 (eating wheat thins now)   24-Hr Dietary Recall First Meal: chic fila grilled chicken and egg  Snack 11: chic fila yogurt parfait + extra granola Second Meal: eaten leftovers Snack: cheetoes   Third Meal: baked chicken + asparagus + mozzarella cheese on it and mashed potatoes Snack: carton of juice Beverages: almond milk, gatorade, water + circl, fruit juice  Alcoholic beverages per week: 3 times a MONTH   Estimated Energy Needs Calories: 1500  NUTRITION DIAGNOSIS  Overweight/obesity ([place]-3.3) related to past poor dietary habits and physical inactivity as evidenced by patient w/ planned sleeve gastrectomy surgery following dietary guidelines for continued weight loss.  NUTRITION INTERVENTION  Nutrition counseling (C-1) and education (E-2) to facilitate bariatric surgery goals.  Encouraged patient to honor their body's internal hunger and fullness cues.  Throughout the day, check in mentally and rate hunger. Stop eating when satisfied not full regardless of how much food is left on the plate.  Get more if still hungry 20-30 minutes later.  The key is to honor satisfaction so throughout the meal, rate fullness factor and stop when comfortably satisfied not physically full. The key is to honor hunger and fullness without any feelings of guilt or shame.  Pay attention to what the internal cues are, rather than any external factors. This will enhance the confidence you have in listening to your own body and following those internal cues enabling you to increase how often you eat when you are hungry not out of appetite and stop when you are satisfied not full.  Encouraged pt to continue to eat balanced meals inclusive of non starchy vegetables 2 times a day 7 days a week Encouraged pt  to choose lean protein sources: limiting beef, pork, sausage, hotdogs, and lunch meat Encourage pt to choose healthy fats such as plant based limiting animal fats Encouraged pt to continue to drink a minium 64 fluid ounces with half being plain water to satisfy proper hydration    Pre-Op Goals Reviewed with the Patient Consume 3 meals per day or try to eat every 3-5 hours: eat breakfast  Avoid concentrated  sugars and fried foods Practice CHEWING your food (aim for applesauce consistency) Practice not drinking 15 minutes before, during, and 30 minutes after each meal and snack Avoid all carbonated beverages (ex: soda, sparkling beverages)  Limit caffeinated beverages (ex: coffee, tea, energy drinks) Avoid all sugar-sweetened beverages (ex: regular soda, sports drinks)  Avoid alcohol  Aim for 64-100 ounces of FLUID daily (with at least half of fluid intake being plain water): cut out Gatorade Track Protein; Aim for at least 60-80 grams of PROTEIN daily Make a list of non-food related activities Physical activity is an important part of a healthy lifestyle so keep it moving! The goal is to reach 150 minutes of exercise per week, including cardiovascular and weight baring activity.  Goals: continue: Go grocery shopping every other week Continue: Aim for 64-100 ounces of FLUID daily (with at least half of fluid intake being plain water): cut out Gatorade Continue: Consume 3 meals per day or try to eat every 3-5 hours: eat breakfast: 3 actual meals  Continue: More veggies Continue: continue to cut back on snacking type foods  NEW: increase intensity for physical activity   Handouts Provided Include  Multivitamin and Calcium handout Protein Foods List with values  Learning Style & Readiness for Change Teaching method utilized: Visual & Auditory  Demonstrated degree of understanding via: Teach Back  Readiness Level: preparation  Barriers to learning/adherence to lifestyle change: none  RD's Notes for Next Visit    MONITORING & EVALUATION Dietary intake, weekly physical activity, body weight, and pre-op goals reached at next nutrition visit.    Next Steps  Pt has completed visits. No further supervised visits required/recommended. Patient is to follow up at [facility] for pre-op class >2 weeks prior to scheduled surgery.

## 2023-04-26 ENCOUNTER — Emergency Department (HOSPITAL_COMMUNITY)
Admission: EM | Admit: 2023-04-26 | Discharge: 2023-04-27 | Disposition: A | Discharge status: Home / Self Care | Payer: 59 | Attending: Emergency Medicine | Admitting: Emergency Medicine

## 2023-04-26 ENCOUNTER — Encounter (HOSPITAL_COMMUNITY): Payer: Self-pay

## 2023-04-26 ENCOUNTER — Other Ambulatory Visit: Payer: Self-pay

## 2023-04-26 DIAGNOSIS — R110 Nausea: Secondary | ICD-10-CM | POA: Diagnosis not present

## 2023-04-26 DIAGNOSIS — R030 Elevated blood-pressure reading, without diagnosis of hypertension: Secondary | ICD-10-CM | POA: Diagnosis not present

## 2023-04-26 DIAGNOSIS — R519 Headache, unspecified: Secondary | ICD-10-CM

## 2023-04-26 LAB — POC URINE PREG, ED: Preg Test, Ur: NEGATIVE

## 2023-04-26 MED ORDER — KETOROLAC TROMETHAMINE 15 MG/ML IJ SOLN
15.0000 mg | Freq: Once | INTRAMUSCULAR | Status: AC
  Administered 2023-04-26: 15 mg via INTRAVENOUS
  Filled 2023-04-26: qty 1

## 2023-04-26 MED ORDER — ONDANSETRON HCL 4 MG/2ML IJ SOLN
4.0000 mg | Freq: Once | INTRAMUSCULAR | Status: AC
  Administered 2023-04-26: 4 mg via INTRAVENOUS
  Filled 2023-04-26: qty 2

## 2023-04-26 MED ORDER — DIPHENHYDRAMINE HCL 50 MG/ML IJ SOLN
50.0000 mg | Freq: Once | INTRAMUSCULAR | Status: AC
  Administered 2023-04-26: 50 mg via INTRAVENOUS
  Filled 2023-04-26: qty 1

## 2023-04-26 MED ORDER — SODIUM CHLORIDE 0.9 % IV BOLUS
1000.0000 mL | Freq: Once | INTRAVENOUS | Status: AC
  Administered 2023-04-26: 1000 mL via INTRAVENOUS

## 2023-04-26 NOTE — ED Provider Notes (Signed)
[place] EMERGENCY DEPARTMENT AT [facility] Provider Note   CSN: [id number] Arrival date & time: 04/26/23  2207     History {Add pertinent medical, surgical, social history, OB history to HPI:1} Chief Complaint  Patient presents with   Headache    Karen Garner is a 27 y.o. female.  The history is provided by the patient and medical records. No language interpreter was used.  Headache    27 year old female presenting today with complaint of headache.  Patient report gradual onset of persistent ongoing frontal headache for the past 2 days.  Headaches described as an achy throbbing sensation moderate intensity with associated nausea that started today.  No fever chills no light sensitivity no vomiting or diarrhea no runny nose sneezing or coughing no neck stiffness no rash no focal numbness or focal weakness.  She does not normally have headaches.  Last menstrual period was 5/6.  She did try some naproxen earlier today and noticed no improvement.  She has been eating and drinking fine.  She denies alcohol or tobacco use.  She denies any vision changes.  Home Medications Prior to Admission medications   Medication Sig Start Date End Date Taking? Authorizing Provider  acetaminophen (TYLENOL) 500 MG tablet Take 1,000 mg by mouth every 6 (six) hours as needed for mild pain, fever or headache. Patient not taking: Reported on 12/24/2022    [provider]  CRANBERRY PO Take 1 tablet by mouth daily.    [provider]  dicyclomine (BENTYL) 20 MG tablet Take 20 mg by mouth 3 (three) times daily as needed for pain. Patient not taking: Reported on 12/24/2022 04/29/21   [provider]  ferrous sulfate 325 (65 FE) MG tablet Take 1 tablet (325 mg total) by mouth daily. Patient not taking: Reported on 05/05/2021 01/06/19   Jacalyn Lefevre, MD  fluticasone [facility]) 50 MCG/ACT nasal spray Place 1-2 sprays into both nostrils daily as needed for allergies or  rhinitis. Patient not taking: Reported on 12/24/2022    [provider]  ibuprofen (ADVIL) 800 MG tablet Take 1 tablet (800 mg total) by mouth 3 (three) times daily. Patient not taking: Reported on 05/05/2021 03/01/21   Roxy Horseman, PA-C  naproxen (NAPROSYN) 500 MG tablet Take 1 tablet (500 mg total) by mouth 2 (two) times daily. Patient not taking: Reported on 12/24/2022 05/24/22   Henderly, Britni A, PA-C  ondansetron (ZOFRAN) 8 MG tablet Take 8 mg by mouth every 8 (eight) hours as needed for nausea/vomiting. Patient not taking: Reported on 12/24/2022 04/29/21   [provider]  ondansetron (ZOFRAN-ODT) 4 MG disintegrating tablet Take 1 tablet (4 mg total) by mouth every 8 (eight) hours as needed for nausea or vomiting. Patient not taking: Reported on 12/24/2022 05/24/22   Henderly, Britni A, PA-C  pantoprazole (PROTONIX) 20 MG tablet Take 1 tablet (20 mg total) by mouth daily. Patient not taking: Reported on 12/24/2022 05/05/21   Bethann Berkshire, MD  VITAMIN D PO Take 1 capsule by mouth daily.    [provider]  WEGOVY 2.4 MG/0.75ML SOAJ Inject 2.4 mg into the skin once a week. Patient not taking: Reported on 12/24/2022 04/15/21   [provider]      Allergies    Patient has no known allergies.    Review of Systems   Review of Systems  Neurological:  Positive for headaches.  All other systems reviewed and are negative.   Physical Exam Updated Vital Signs BP (!) 152/65 (BP Location:  Left Arm)   Pulse 71   Temp 98.3 F (36.8 C) (Oral)   Resp 18   Ht 5\' 3"  (1.6 m)   Wt (!) 156.5 kg   LMP 04/04/2023 (Exact Date)   SpO2 99%   BMI 61.11 kg/m  Physical Exam Vitals and nursing note reviewed.  Constitutional:      General: She is not in acute distress.    Appearance: She is well-developed.  HENT:     Head: Atraumatic.  Eyes:     Conjunctiva/sclera: Conjunctivae normal.  Cardiovascular:     Rate and Rhythm: Normal rate and regular rhythm.      Pulses: Normal pulses.     Heart sounds: Normal heart sounds.  Pulmonary:     Effort: Pulmonary effort is normal.  Abdominal:     Palpations: Abdomen is soft.     Tenderness: There is no abdominal tenderness.  Musculoskeletal:     Cervical back: Neck supple.  Skin:    Findings: No rash.  Neurological:     Mental Status: She is alert.  Psychiatric:        Mood and Affect: Mood normal.     ED Results / Procedures / Treatments   Labs (all labs ordered are listed, but only abnormal results are displayed) Labs Reviewed - No data to display  EKG None  Radiology No results found.  Procedures Procedures  {Document cardiac monitor, telemetry assessment procedure when appropriate:1}  Medications Ordered in ED Medications - No data to display  ED Course/ Medical Decision Making/ A&P   {   Click here for ABCD2, HEART and other calculatorsREFRESH Note before signing :1}                          Medical Decision Making  BP (!) 152/65 (BP Location: Left Arm)   Pulse 71   Temp 98.3 F (36.8 C) (Oral)   Resp 18   Ht 5\' 3"  (1.6 m)   Wt (!) 156.5 kg   LMP 04/04/2023 (Exact Date)   SpO2 99%   BMI 61.11 kg/m   84:13 PM  27 year old female presenting today with complaint of headache.  Patient report gradual onset of persistent ongoing frontal headache for the past 2 days.  Headaches described as an achy throbbing sensation moderate intensity with associated nausea that started today.  No fever chills no light sensitivity no vomiting or diarrhea no runny nose sneezing or coughing no neck stiffness no rash no focal numbness or focal weakness.  She does not normally have headaches.  Last menstrual period was 5/6.  She did try some naproxen earlier today and noticed no improvement.  She has been eating and drinking fine.  She denies alcohol or tobacco use.  She denies any vision changes.  On exam this is an obese female resting comfortably in the chair appears to be in no acute  discomfort.  She is talking on the phone.  Exam overall reassuring no nuchal rigidity concerning for meningitis, no focal neurodeficits concerning for stroke or space-occupying lesion.  Presentation not concerning for subarachnoid hemorrhage.  Vital signs notable for elevated blood pressure of 152/65.  Patient is afebrile no hypoxia.  Will provide supportive care including migraine cocktail.  {Document critical care time when appropriate:1} {Document review of labs and clinical decision tools ie heart score, Chads2Vasc2 etc:1}  {Document your independent review of radiology images, and any outside records:1} {Document your discussion with family members, caretakers, and with  consultants:1} {Document social determinants of health affecting pt's care:1} {Document your decision making why or why not admission, treatments were needed:1} Final Clinical Impression(s) / ED Diagnoses Final diagnoses:  None    Rx / DC Orders ED Discharge Orders     None

## 2023-04-26 NOTE — ED Triage Notes (Signed)
Pt arrives c/o headache x2 days. Pt states that she also developed nausea today - denies emesis. Took naproxen around 1800 without relief. Denies hx of headaches/migraines.

## 2023-04-26 NOTE — Progress Notes (Signed)
Surgery orders requested via Epic inbox. °

## 2023-04-27 ENCOUNTER — Encounter (HOSPITAL_COMMUNITY): Payer: Self-pay

## 2023-04-27 LAB — CBG MONITORING, ED: Glucose-Capillary: 85 mg/dL (ref 70–99)

## 2023-04-27 MED ORDER — ONDANSETRON HCL 4 MG PO TABS: 4.0000 mg | ORAL_TABLET | Freq: Three times a day (TID) | ORAL | 0 refills | Status: DC | PRN

## 2023-04-27 NOTE — ED Notes (Signed)
Patient given discharge instructions and follow up care. Patient verbalized understanding. IV removed with catheter intact. Patient ambulatory out of ED. 

## 2023-04-29 ENCOUNTER — Ambulatory Visit: Payer: Self-pay | Admitting: General Surgery

## 2023-04-29 NOTE — Patient Instructions (Signed)
DUE TO COVID-19 ONLY TWO VISITORS  (aged 25 and older)  ARE ALLOWED TO COME WITH YOU AND STAY IN THE WAITING ROOM ONLY DURING PRE OP AND PROCEDURE.   **NO VISITORS ARE ALLOWED IN THE SHORT STAY AREA OR RECOVERY ROOM!!**  IF YOU WILL BE ADMITTED INTO THE HOSPITAL YOU ARE ALLOWED ONLY FOUR SUPPORT PEOPLE DURING VISITATION HOURS ONLY (7 AM -8PM)   The support person(s) must pass our screening, gel in and out, and wear a mask at all times, including in the patient's room. Patients must also wear a mask when staff or their support person are in the room. Visitors GUEST BADGE MUST BE WORN VISIBLY  One adult visitor may remain with you overnight and MUST be in the room by 8 P.M.     Your procedure is scheduled on: 05/16/23   Report to [facility] Main Entrance    Report to admitting at : 5:15 AM   Call this number if you have problems the morning of surgery [phone]   MORNING OF SURGERY DRINK:   DRINK 1 G2 drink BEFORE YOU LEAVE HOME, DRINK ALL OF THE  G2 DRINK AT ONE TIME.   NO SOLID FOOD AFTER 600 PM THE NIGHT BEFORE YOUR SURGERY. YOU MAY DRINK CLEAR FLUIDS. THE G2 DRINK YOU DRINK BEFORE YOU LEAVE HOME WILL BE THE LAST FLUIDS YOU DRINK BEFORE SURGERY.  PAIN IS EXPECTED AFTER SURGERY AND WILL NOT BE COMPLETELY ELIMINATED. AMBULATION AND TYLENOL WILL HELP REDUCE INCISIONAL AND GAS PAIN. MOVEMENT IS KEY!  YOU ARE EXPECTED TO BE OUT OF BED WITHIN 4 HOURS OF ADMISSION TO YOUR PATIENT ROOM.  SITTING IN THE RECLINER THROUGHOUT THE DAY IS IMPORTANT FOR DRINKING FLUIDS AND MOVING GAS THROUGHOUT THE GI TRACT.  COMPRESSION STOCKINGS SHOULD BE WORN [facility] STAY UNLESS YOU ARE WALKING.   INCENTIVE SPIROMETER SHOULD BE USED EVERY HOUR WHILE AWAKE TO DECREASE POST-OPERATIVE COMPLICATIONS SUCH AS PNEUMONIA.  WHEN DISCHARGED HOME, IT IS IMPORTANT TO CONTINUE TO WALK EVERY HOUR AND USE THE INCENTIVE SPIROMETER EVERY HOUR.           Clear liquids starting at 6:00 PM the day  before surgery. until : 4:30 AM DAY OF SURGERY  Water Black Coffee (sugar ok, NO MILK/CREAM OR CREAMERS)  Tea (sugar ok, NO MILK/CREAM OR CREAMERS) regular and decaf                             Plain Jell-O (NO RED)                                           Fruit ices (not with fruit pulp, NO RED)                                     Popsicles (NO RED)                                                                  Juice: apple, WHITE grape, WHITE cranberry Sports drinks like Gatorade (NO RED)  The day of surgery:  Drink ONE (1) Pre-Surgery Clear G2 at : 4:30 AM the morning of surgery. Drink in one sitting. Do not sip.  This drink was given to you during your hospital  pre-op appointment visit. Nothing else to drink after completing the  Pre-Surgery Clear Ensure or G2.          If you have questions, please contact your surgeon's office.  Oral Hygiene is also important to reduce your risk of infection.                                    Remember - BRUSH YOUR TEETH THE MORNING OF SURGERY WITH YOUR REGULAR TOOTHPASTE  DENTURES WILL BE REMOVED PRIOR TO SURGERY PLEASE DO NOT APPLY "Poly grip" OR ADHESIVES!!!   Do NOT smoke after Midnight   Take these medicines the morning of surgery with A SIP OF WATER: N/A. Use inhalers as usual.                              You may not have any metal on your body including hair pins, jewelry, and body piercing             Do not wear make-up, lotions, powders, perfumes/cologne, or deodorant  Do not wear nail polish including gel and S&S, artificial/acrylic nails, or any other type of covering on natural nails including finger and toenails. If you have artificial nails, gel coating, etc. that needs to be removed by a nail salon please have this removed prior to surgery or surgery may need to be canceled/ delayed if the surgeon/ anesthesia feels like they are unable to be safely monitored.   Do not shave  48 hours prior to surgery.   Do not bring  valuables to the hospital. [facility] IS NOT             RESPONSIBLE   FOR VALUABLES.   Contacts, glasses, or bridgework may not be worn into surgery.   Bring small overnight bag day of surgery.   DO NOT BRING YOUR HOME MEDICATIONS TO THE HOSPITAL. PHARMACY WILL DISPENSE MEDICATIONS LISTED ON YOUR MEDICATION LIST TO YOU DURING YOUR ADMISSION IN THE HOSPITAL!    Patients discharged on the day of surgery will not be allowed to drive home.  Someone NEEDS to stay with you for the first 24 hours after anesthesia.   Special Instructions: Bring a copy of your healthcare power of attorney and living will documents         the day of surgery if you haven't scanned them before.              Please read over the following fact sheets you were given: IF YOU HAVE QUESTIONS ABOUT YOUR PRE-OP INSTRUCTIONS PLEASE CALL [phone]    [facility] - Preparing for Surgery Before surgery, you can play an important role.  Because skin is not sterile, your skin needs to be as free of germs as possible.  You can reduce the number of germs on your skin by washing with CHG (chlorahexidine gluconate) soap before surgery.  CHG is an antiseptic cleaner which kills germs and bonds with the skin to continue killing germs even after washing. Please DO NOT use if you have an allergy to CHG or antibacterial soaps.  If your skin becomes reddened/irritated stop using the CHG and  inform your nurse when you arrive at Short Stay. Do not shave (including legs and underarms) for at least 48 hours prior to the first CHG shower.  You may shave your face/neck. Please follow these instructions carefully:  1.  Shower with CHG Soap the night before surgery and the  morning of Surgery.  2.  If you choose to wash your hair, wash your hair first as usual with your  normal  shampoo.  3.  After you shampoo, rinse your hair and body thoroughly to remove the  shampoo.                           4.  Use CHG as you would any other liquid soap.   You can apply chg directly  to the skin and wash                       Gently with a scrungie or clean washcloth.  5.  Apply the CHG Soap to your body ONLY FROM THE NECK DOWN.   Do not use on face/ open                           Wound or open sores. Avoid contact with eyes, ears mouth and genitals (private parts).                       Wash face,  Genitals (private parts) with your normal soap.             6.  Wash thoroughly, paying special attention to the area where your surgery  will be performed.  7.  Thoroughly rinse your body with warm water from the neck down.  8.  DO NOT shower/wash with your normal soap after using and rinsing off  the CHG Soap.                9.  Pat yourself dry with a clean towel.            10.  Wear clean pajamas.            11.  Place clean sheets on your bed the night of your first shower and do not  sleep with pets. Day of Surgery : Do not apply any lotions/deodorants the morning of surgery.  Please wear clean clothes to the hospital/surgery center.  FAILURE TO FOLLOW THESE INSTRUCTIONS MAY RESULT IN THE CANCELLATION OF YOUR SURGERY PATIENT SIGNATURE_________________________________  NURSE SIGNATURE__________________________________  ________________________________________________________________________

## 2023-04-29 NOTE — Progress Notes (Signed)
Anesthesia Review:  PCP: Cardiologist : Chest x-ray : 12/10/22- 2 view  EKG : 12/24/22  Echo : 01/18/23  Stress test: Cardiac Cath :  Activity level:  Sleep Study/ CPAP : Fasting Blood Sugar :      / Checks Blood Sugar -- times a day:   Blood Thinner/ Instructions /Last Dose: ASA / Instructions/ Last Dose :    04/26/23- In ED for headache

## 2023-04-29 NOTE — Progress Notes (Signed)
Second request for pre op orders in [facility] spoke with Nicole at [facility].  

## 2023-05-02 ENCOUNTER — Encounter: Payer: Self-pay | Admitting: Skilled Nursing Facility1

## 2023-05-02 ENCOUNTER — Encounter: Payer: 59 | Attending: General Surgery | Admitting: Skilled Nursing Facility1

## 2023-05-02 VITALS — Wt 353.8 lb

## 2023-05-02 DIAGNOSIS — E669 Obesity, unspecified: Secondary | ICD-10-CM | POA: Diagnosis present

## 2023-05-02 NOTE — Progress Notes (Signed)
Pre-Operative Nutrition Class:    Patient was seen on 05/02/2023 for Pre-Operative Bariatric Surgery Education at [facility]    Surgery date: 05/16/2023 Surgery type: sleeve Start weight at [facility]: 341.8 Weight today: 353.8   The following the learning objectives were met by the patient during this course: Identify Pre-Op Dietary Goals and will begin 2 weeks pre-operatively Identify appropriate sources of fluids and proteins  State protein recommendations and appropriate sources pre and post-operatively Identify Post-Operative Dietary Goals and will follow for 2 weeks post-operatively Identify appropriate multivitamin and calcium sources Describe the need for physical activity post-operatively and will follow MD recommendations State when to call healthcare provider regarding medication questions or post-operative complications When having a diagnosis of diabetes understanding hypoglycemia symptoms and the inclusion of 1 complex carbohydrate per meal  Handouts given during class include: Pre-Op Bariatric Surgery Diet Handout Protein Shake Handout Post-Op Bariatric Surgery Nutrition Handout BELT Program Information Flyer Support Group Information Flyer [facility] Bariatric Supplements Price List  Follow-Up Plan: Patient will follow-up at [facility] 2 weeks post operatively for diet advancement per MD.

## 2023-05-03 ENCOUNTER — Encounter (HOSPITAL_COMMUNITY): Payer: Self-pay

## 2023-05-03 ENCOUNTER — Other Ambulatory Visit: Payer: Self-pay

## 2023-05-03 ENCOUNTER — Encounter (HOSPITAL_COMMUNITY)
Admission: RE | Admit: 2023-05-03 | Discharge: 2023-05-03 | Disposition: A | Discharge status: Home / Self Care | Payer: 59 | Source: Ambulatory Visit | Attending: General Surgery | Admitting: General Surgery

## 2023-05-03 DIAGNOSIS — Z01818 Encounter for other preprocedural examination: Secondary | ICD-10-CM | POA: Insufficient documentation

## 2023-05-03 HISTORY — DX: Cardiac murmur, unspecified: R01.1

## 2023-05-03 LAB — CBC WITH DIFFERENTIAL/PLATELET
Abs Immature Granulocytes: 0.02 10*3/uL (ref 0.00–0.07)
Basophils Absolute: 0 10*3/uL (ref 0.0–0.1)
Basophils Relative: 0 %
Eosinophils Absolute: 0 10*3/uL (ref 0.0–0.5)
Eosinophils Relative: 0 %
HCT: 36.9 % (ref 36.0–46.0)
Hemoglobin: 11.2 g/dL — ABNORMAL LOW (ref 12.0–15.0)
Immature Granulocytes: 0 %
Lymphocytes Relative: 29 %
Lymphs Abs: 1.6 10*3/uL (ref 0.7–4.0)
MCH: 24.8 pg — ABNORMAL LOW (ref 26.0–34.0)
MCHC: 30.4 g/dL (ref 30.0–36.0)
MCV: 81.6 fL (ref 80.0–100.0)
Monocytes Absolute: 0.7 10*3/uL (ref 0.1–1.0)
Monocytes Relative: 12 %
Neutro Abs: 3.3 10*3/uL (ref 1.7–7.7)
Neutrophils Relative %: 59 %
Platelets: 407 10*3/uL — ABNORMAL HIGH (ref 150–400)
RBC: 4.52 MIL/uL (ref 3.87–5.11)
RDW: 14.6 % (ref 11.5–15.5)
WBC: 5.6 10*3/uL (ref 4.0–10.5)
nRBC: 0 % (ref 0.0–0.2)

## 2023-05-03 LAB — COMPREHENSIVE METABOLIC PANEL
ALT: 26 U/L (ref 0–44)
AST: 22 U/L (ref 15–41)
Albumin: 3.7 g/dL (ref 3.5–5.0)
Alkaline Phosphatase: 56 U/L (ref 38–126)
Anion gap: 9 (ref 5–15)
BUN: 11 mg/dL (ref 6–20)
CO2: 23 mmol/L (ref 22–32)
Calcium: 8.5 mg/dL — ABNORMAL LOW (ref 8.9–10.3)
Chloride: 104 mmol/L (ref 98–111)
Creatinine, Ser: 0.82 mg/dL (ref 0.44–1.00)
GFR, Estimated: >60 mL/min (ref >60)
Glucose, Bld: 86 mg/dL (ref 70–99)
Potassium: 3.7 mmol/L (ref 3.5–5.1)
Sodium: 136 mmol/L (ref 135–145)
Total Bilirubin: 0.4 mg/dL (ref 0.3–1.2)
Total Protein: 7.8 g/dL (ref 6.5–8.1)

## 2023-05-03 LAB — TYPE AND SCREEN
ABO/RH(D): O POS
Antibody Screen: NEGATIVE

## 2023-05-03 NOTE — Progress Notes (Signed)
For Short Stay: COVID SWAB appointment date:  Bowel Prep reminder:   For Anesthesia: PCP - Marylu Lund Britt:PA-C Cardiologist - Dr. Acie Fredrickson O'Neal. : Clearance: 12/24/22  Chest x-ray - 12/10/22 EKG - 12/24/22 Stress Test -  ECHO - 01/18/23 Cardiac Cath -  Pacemaker/ICD device last checked: Pacemaker orders received: Device Rep notified:  Spinal Cord Stimulator:  Sleep Study - N/A CPAP -   Fasting Blood Sugar - N/A Checks Blood Sugar _____ times a day Date and result of last Hgb A1c-  Last dose of GLP1 agonist- N/A GLP1 instructions:   Last dose of SGLT-2 inhibitors- N/A SGLT-2 instructions:   Blood Thinner Instructions:  N/A Aspirin Instructions: Last Dose:  Activity level: Can go up a flight of stairs and activities of daily living without stopping and without chest pain and/or shortness of breath   Able to exercise without chest pain and/or shortness of breath  Anesthesia review: Hx: Heart Murmur.  Patient denies shortness of breath, fever, cough and chest pain at PAT appointment   Patient verbalized understanding of instructions that were given to them at the PAT appointment. Patient was also instructed that they will need to review over the PAT instructions again at home before surgery.

## 2023-05-15 NOTE — Anesthesia Preprocedure Evaluation (Signed)
Anesthesia Evaluation  Patient identified by MRN, date of birth, ID band Patient awake    Reviewed: Allergy & Precautions, H&P , NPO status , Patient's Chart, lab work & pertinent test results  Airway Mallampati: III  TM Distance: >3 FB Neck ROM: Full    Dental no notable dental hx. (+) Teeth Intact, Dental Advisory Given   Pulmonary neg pulmonary ROS   Pulmonary exam normal breath sounds clear to auscultation       Cardiovascular Exercise Tolerance: Good negative cardio ROS  Rhythm:Regular Rate:Normal     Neuro/Psych negative neurological ROS  negative psych ROS   GI/Hepatic negative GI ROS, Neg liver ROS,,,  Endo/Other    Morbid obesity  Renal/GU negative Renal ROS  negative genitourinary   Musculoskeletal   Abdominal   Peds  Hematology negative hematology ROS (+)   Anesthesia Other Findings   Reproductive/Obstetrics negative OB ROS                             Anesthesia Physical Anesthesia Plan  ASA: 3  Anesthesia Plan: General   Post-op Pain Management: Tylenol PO (pre-op)*, Toradol IV (intra-op)* and Lidocaine infusion*   Induction: Intravenous  PONV Risk Score and Plan: 4 or greater and Ondansetron, Dexamethasone, Midazolam and Scopolamine patch - Pre-op  Airway Management Planned: Oral ETT  Additional Equipment:   Intra-op Plan:   Post-operative Plan: Extubation in OR  Informed Consent: I have reviewed the patients History and Physical, chart, labs and discussed the procedure including the risks, benefits and alternatives for the proposed anesthesia with the patient or authorized representative who has indicated his/her understanding and acceptance.     Dental advisory given  Plan Discussed with: CRNA  Anesthesia Plan Comments:        Anesthesia Quick Evaluation

## 2023-05-16 ENCOUNTER — Inpatient Hospital Stay (HOSPITAL_COMMUNITY): Payer: 59 | Admitting: Physician Assistant

## 2023-05-16 ENCOUNTER — Inpatient Hospital Stay (HOSPITAL_BASED_OUTPATIENT_CLINIC_OR_DEPARTMENT_OTHER): Payer: 59 | Admitting: Anesthesiology

## 2023-05-16 ENCOUNTER — Other Ambulatory Visit: Payer: Self-pay

## 2023-05-16 ENCOUNTER — Encounter (HOSPITAL_COMMUNITY): Payer: Self-pay | Admitting: General Surgery

## 2023-05-16 ENCOUNTER — Encounter (HOSPITAL_COMMUNITY)
Admission: RE | Disposition: A | Discharge status: Home / Self Care | Payer: Self-pay | Source: Ambulatory Visit | Attending: General Surgery

## 2023-05-16 ENCOUNTER — Ambulatory Visit (HOSPITAL_COMMUNITY)
Admission: RE | Admit: 2023-05-16 | Discharge: 2023-05-18 | Disposition: A | Discharge status: Home / Self Care | Payer: 59 | Source: Ambulatory Visit | Attending: General Surgery | Admitting: General Surgery

## 2023-05-16 DIAGNOSIS — Z9884 Bariatric surgery status: Principal | ICD-10-CM

## 2023-05-16 DIAGNOSIS — E7800 Pure hypercholesterolemia, unspecified: Secondary | ICD-10-CM

## 2023-05-16 DIAGNOSIS — E559 Vitamin D deficiency, unspecified: Secondary | ICD-10-CM | POA: Insufficient documentation

## 2023-05-16 DIAGNOSIS — Z6841 Body Mass Index (BMI) 40.0 and over, adult: Secondary | ICD-10-CM | POA: Insufficient documentation

## 2023-05-16 HISTORY — PX: UPPER GI ENDOSCOPY: SHX6162

## 2023-05-16 HISTORY — PX: LAPAROSCOPIC GASTRIC SLEEVE RESECTION: SHX5895

## 2023-05-16 LAB — POCT PREGNANCY, URINE: Preg Test, Ur: NEGATIVE

## 2023-05-16 LAB — ABO/RH: ABO/RH(D): O POS

## 2023-05-16 LAB — HEMOGLOBIN AND HEMATOCRIT, BLOOD
HCT: 38.1 % (ref 36.0–46.0)
Hemoglobin: 11.7 g/dL — ABNORMAL LOW (ref 12.0–15.0)

## 2023-05-16 SURGERY — GASTRECTOMY, SLEEVE, LAPAROSCOPIC
Anesthesia: General

## 2023-05-16 MED ORDER — BUPIVACAINE-EPINEPHRINE (PF) 0.25% -1:200000 IJ SOLN
INTRAMUSCULAR | Status: DC | PRN
  Administered 2023-05-16: 50 mL

## 2023-05-16 MED ORDER — CHLORHEXIDINE GLUCONATE 4 % EX SOLN: Freq: Once | CUTANEOUS | Status: DC

## 2023-05-16 MED ORDER — FENTANYL CITRATE (PF) 100 MCG/2ML IJ SOLN
INTRAMUSCULAR | Status: DC | PRN
  Administered 2023-05-16: 25 ug via INTRAVENOUS
  Administered 2023-05-16: 100 ug via INTRAVENOUS

## 2023-05-16 MED ORDER — HYDROMORPHONE HCL 1 MG/ML IJ SOLN
0.2500 mg | INTRAMUSCULAR | Status: DC | PRN
  Administered 2023-05-16: 0.5 mg via INTRAVENOUS
  Administered 2023-05-16: 0.25 mg via INTRAVENOUS
  Administered 2023-05-16: 0.5 mg via INTRAVENOUS

## 2023-05-16 MED ORDER — ONDANSETRON HCL 4 MG/2ML IJ SOLN
INTRAMUSCULAR | Status: DC | PRN
  Administered 2023-05-16: 4 mg via INTRAVENOUS

## 2023-05-16 MED ORDER — ENSURE MAX PROTEIN PO LIQD
2.0000 [oz_av] | ORAL | Status: DC
  Administered 2023-05-17 – 2023-05-18 (×10): 2 [oz_av] via ORAL

## 2023-05-16 MED ORDER — BUPIVACAINE LIPOSOME 1.3 % IJ SUSP
INTRAMUSCULAR | Status: AC
  Filled 2023-05-16: qty 20

## 2023-05-16 MED ORDER — SODIUM CHLORIDE 0.9 % IV SOLN
12.5000 mg | Freq: Four times a day (QID) | INTRAVENOUS | Status: DC | PRN
  Administered 2023-05-16 – 2023-05-18 (×4): 12.5 mg via INTRAVENOUS
  Filled 2023-05-16 (×2): qty 12.5
  Filled 2023-05-16: qty 0.5
  Filled 2023-05-16 (×2): qty 12.5

## 2023-05-16 MED ORDER — SCOPOLAMINE 1 MG/3DAYS TD PT72
1.0000 | MEDICATED_PATCH | TRANSDERMAL | Status: DC
  Administered 2023-05-16: 1.5 mg via TRANSDERMAL
  Filled 2023-05-16: qty 1

## 2023-05-16 MED ORDER — LACTATED RINGERS IR SOLN
Status: DC | PRN
  Administered 2023-05-16: 1000 mL

## 2023-05-16 MED ORDER — DEXAMETHASONE SODIUM PHOSPHATE 10 MG/ML IJ SOLN
INTRAMUSCULAR | Status: AC
  Filled 2023-05-16: qty 1

## 2023-05-16 MED ORDER — HEPARIN SODIUM (PORCINE) 5000 UNIT/ML IJ SOLN
5000.0000 [IU] | Freq: Three times a day (TID) | INTRAMUSCULAR | Status: DC
  Administered 2023-05-16 – 2023-05-18 (×5): 5000 [IU] via SUBCUTANEOUS
  Filled 2023-05-16 (×5): qty 1

## 2023-05-16 MED ORDER — MIDAZOLAM HCL 2 MG/2ML IJ SOLN
INTRAMUSCULAR | Status: DC | PRN
  Administered 2023-05-16: 2 mg via INTRAVENOUS

## 2023-05-16 MED ORDER — FENTANYL CITRATE (PF) 100 MCG/2ML IJ SOLN
INTRAMUSCULAR | Status: AC
  Filled 2023-05-16: qty 2

## 2023-05-16 MED ORDER — APREPITANT 40 MG PO CAPS
40.0000 mg | ORAL_CAPSULE | ORAL | Status: AC
  Administered 2023-05-16: 40 mg via ORAL
  Filled 2023-05-16: qty 1

## 2023-05-16 MED ORDER — LACTATED RINGERS IV SOLN: INTRAVENOUS | Status: DC

## 2023-05-16 MED ORDER — ROCURONIUM BROMIDE 10 MG/ML (PF) SYRINGE
PREFILLED_SYRINGE | INTRAVENOUS | Status: AC
  Filled 2023-05-16: qty 10

## 2023-05-16 MED ORDER — CHLORHEXIDINE GLUCONATE 0.12 % MT SOLN
15.0000 mL | Freq: Once | OROMUCOSAL | Status: AC
  Administered 2023-05-16: 15 mL via OROMUCOSAL

## 2023-05-16 MED ORDER — BUPIVACAINE-EPINEPHRINE 0.25% -1:200000 IJ SOLN
INTRAMUSCULAR | Status: AC
  Filled 2023-05-16: qty 1

## 2023-05-16 MED ORDER — ORAL CARE MOUTH RINSE: 15.0000 mL | Freq: Once | OROMUCOSAL | Status: AC

## 2023-05-16 MED ORDER — HYDROMORPHONE HCL 1 MG/ML IJ SOLN
INTRAMUSCULAR | Status: AC
  Administered 2023-05-16: 0.5 mg via INTRAVENOUS
  Filled 2023-05-16: qty 1

## 2023-05-16 MED ORDER — ACETAMINOPHEN 500 MG PO TABS
1000.0000 mg | ORAL_TABLET | Freq: Three times a day (TID) | ORAL | Status: DC
  Administered 2023-05-16 – 2023-05-17 (×3): 1000 mg via ORAL
  Filled 2023-05-16 (×5): qty 2

## 2023-05-16 MED ORDER — HEPARIN SODIUM (PORCINE) 5000 UNIT/ML IJ SOLN
5000.0000 [IU] | INTRAMUSCULAR | Status: AC
  Administered 2023-05-16: 5000 [IU] via SUBCUTANEOUS
  Filled 2023-05-16: qty 1

## 2023-05-16 MED ORDER — ACETAMINOPHEN 500 MG PO TABS: 1000.0000 mg | ORAL_TABLET | Freq: Once | ORAL | Status: DC

## 2023-05-16 MED ORDER — DEXAMETHASONE SODIUM PHOSPHATE 10 MG/ML IJ SOLN
INTRAMUSCULAR | Status: DC | PRN
  Administered 2023-05-16: 8 mg via INTRAVENOUS

## 2023-05-16 MED ORDER — PROPOFOL 10 MG/ML IV BOLUS
INTRAVENOUS | Status: DC | PRN
  Administered 2023-05-16: 200 mg via INTRAVENOUS

## 2023-05-16 MED ORDER — DEXAMETHASONE SODIUM PHOSPHATE 4 MG/ML IJ SOLN: 4.0000 mg | INTRAMUSCULAR | Status: DC

## 2023-05-16 MED ORDER — LIDOCAINE 2% (20 MG/ML) 5 ML SYRINGE
INTRAMUSCULAR | Status: DC | PRN
  Administered 2023-05-16: 60 mg via INTRAVENOUS

## 2023-05-16 MED ORDER — MORPHINE SULFATE (PF) 2 MG/ML IV SOLN: 1.0000 mg | INTRAVENOUS | Status: DC | PRN

## 2023-05-16 MED ORDER — SODIUM CHLORIDE 0.9 % IV SOLN
2.0000 g | INTRAVENOUS | Status: AC
  Administered 2023-05-16: 2 g via INTRAVENOUS
  Filled 2023-05-16: qty 2

## 2023-05-16 MED ORDER — KETAMINE HCL 10 MG/ML IJ SOLN
INTRAMUSCULAR | Status: DC | PRN
  Administered 2023-05-16: 20 mg via INTRAVENOUS

## 2023-05-16 MED ORDER — MIDAZOLAM HCL 2 MG/2ML IJ SOLN
INTRAMUSCULAR | Status: AC
  Filled 2023-05-16: qty 2

## 2023-05-16 MED ORDER — ACETAMINOPHEN 500 MG PO TABS
1000.0000 mg | ORAL_TABLET | ORAL | Status: AC
  Administered 2023-05-16: 1000 mg via ORAL
  Filled 2023-05-16: qty 2

## 2023-05-16 MED ORDER — LIDOCAINE HCL (PF) 2 % IJ SOLN
INTRAMUSCULAR | Status: AC
  Filled 2023-05-16: qty 5

## 2023-05-16 MED ORDER — ONDANSETRON HCL 4 MG/2ML IJ SOLN
4.0000 mg | Freq: Four times a day (QID) | INTRAMUSCULAR | Status: DC | PRN
  Administered 2023-05-16 – 2023-05-17 (×2): 4 mg via INTRAVENOUS
  Filled 2023-05-16 (×3): qty 2

## 2023-05-16 MED ORDER — SIMETHICONE 80 MG PO CHEW
80.0000 mg | CHEWABLE_TABLET | Freq: Four times a day (QID) | ORAL | Status: DC | PRN
  Administered 2023-05-17 – 2023-05-18 (×2): 80 mg via ORAL
  Filled 2023-05-16 (×2): qty 1

## 2023-05-16 MED ORDER — ONDANSETRON HCL 4 MG/2ML IJ SOLN
INTRAMUSCULAR | Status: AC
  Filled 2023-05-16: qty 2

## 2023-05-16 MED ORDER — PROPOFOL 10 MG/ML IV BOLUS
INTRAVENOUS | Status: AC
  Filled 2023-05-16: qty 20

## 2023-05-16 MED ORDER — KETOROLAC TROMETHAMINE 30 MG/ML IJ SOLN
INTRAMUSCULAR | Status: DC | PRN
  Administered 2023-05-16: 30 mg via INTRAVENOUS

## 2023-05-16 MED ORDER — ACETAMINOPHEN 160 MG/5ML PO SOLN
1000.0000 mg | Freq: Three times a day (TID) | ORAL | Status: DC
  Filled 2023-05-16: qty 40.6

## 2023-05-16 MED ORDER — KCL IN DEXTROSE-NACL 20-5-0.45 MEQ/L-%-% IV SOLN
INTRAVENOUS | Status: DC
  Filled 2023-05-16 (×6): qty 1000

## 2023-05-16 MED ORDER — BUPIVACAINE LIPOSOME 1.3 % IJ SUSP: 20.0000 mL | Freq: Once | INTRAMUSCULAR | Status: DC

## 2023-05-16 MED ORDER — HYDROMORPHONE HCL 1 MG/ML IJ SOLN
INTRAMUSCULAR | Status: AC
  Administered 2023-05-16: 0.25 mg via INTRAVENOUS
  Filled 2023-05-16: qty 1

## 2023-05-16 MED ORDER — SUGAMMADEX SODIUM 200 MG/2ML IV SOLN
INTRAVENOUS | Status: DC | PRN
  Administered 2023-05-16: 320 mg via INTRAVENOUS

## 2023-05-16 MED ORDER — HYDRALAZINE HCL 20 MG/ML IJ SOLN: 10.0000 mg | INTRAMUSCULAR | Status: DC | PRN

## 2023-05-16 MED ORDER — ROCURONIUM BROMIDE 10 MG/ML (PF) SYRINGE
PREFILLED_SYRINGE | INTRAVENOUS | Status: DC | PRN
  Administered 2023-05-16: 80 mg via INTRAVENOUS

## 2023-05-16 MED ORDER — PANTOPRAZOLE SODIUM 40 MG IV SOLR
40.0000 mg | Freq: Every day | INTRAVENOUS | Status: DC
  Administered 2023-05-16 – 2023-05-17 (×2): 40 mg via INTRAVENOUS
  Filled 2023-05-16 (×2): qty 10

## 2023-05-16 MED ORDER — OXYCODONE HCL 5 MG/5ML PO SOLN
5.0000 mg | Freq: Four times a day (QID) | ORAL | Status: DC | PRN
  Administered 2023-05-16 – 2023-05-18 (×4): 5 mg via ORAL
  Filled 2023-05-16 (×6): qty 5

## 2023-05-16 MED ORDER — KETAMINE HCL 50 MG/5ML IJ SOSY
PREFILLED_SYRINGE | INTRAMUSCULAR | Status: AC
  Filled 2023-05-16: qty 5

## 2023-05-16 SURGICAL SUPPLY — 82 items
ANTIFOG SOL W/FOAM PAD STRL (MISCELLANEOUS) ×1
APL PRP STRL LF DISP 70% ISPRP (MISCELLANEOUS) ×2
APL SRG 32X5 SNPLK LF DISP (MISCELLANEOUS)
APL SWBSTK 6 STRL LF DISP (MISCELLANEOUS)
APPLICATOR COTTON TIP 6 STRL (MISCELLANEOUS) IMPLANT
APPLICATOR COTTON TIP 6IN STRL (MISCELLANEOUS)
APPLIER CLIP ROT 10 11.4 M/L (STAPLE)
APPLIER CLIP ROT 13.4 12 LRG (CLIP) ×1
APR CLP LRG 13.4X12 ROT 20 MLT (CLIP) ×1
APR CLP MED LRG 11.4X10 (STAPLE)
BAG COUNTER SPONGE SURGICOUNT (BAG) IMPLANT
BAG SPNG CNTER NS LX DISP (BAG)
BLADE SURG SZ11 CARB STEEL (BLADE) ×1 IMPLANT
CABLE HIGH FREQUENCY MONO STRZ (ELECTRODE) IMPLANT
CHLORAPREP W/TINT 26 (MISCELLANEOUS) ×2 IMPLANT
CLIP APPLIE ROT 10 11.4 M/L (STAPLE) IMPLANT
CLIP APPLIE ROT 13.4 12 LRG (CLIP) IMPLANT
COVER SURGICAL LIGHT HANDLE (MISCELLANEOUS) ×1 IMPLANT
DEVICE SUT QUICK LOAD TK 5 (SUTURE) IMPLANT
DEVICE SUT TI-KNOT TK 5X26 (SUTURE) IMPLANT
DEVICE SUTURE ENDOST 10MM (ENDOMECHANICALS) IMPLANT
DISSECTOR BLUNT TIP ENDO 5MM (MISCELLANEOUS) IMPLANT
DRAPE UTILITY XL STRL (DRAPES) ×2 IMPLANT
DRSG TEGADERM 2-3/8X2-3/4 SM (GAUZE/BANDAGES/DRESSINGS) ×6 IMPLANT
ELECT L-HOOK LAP 45CM DISP (ELECTROSURGICAL)
ELECT REM PT RETURN 15FT ADLT (MISCELLANEOUS) ×1 IMPLANT
ELECTRODE L-HOOK LAP 45CM DISP (ELECTROSURGICAL) IMPLANT
GAUZE SPONGE 2X2 8PLY STRL LF (GAUZE/BANDAGES/DRESSINGS) IMPLANT
GAUZE SPONGE 4X4 12PLY STRL (GAUZE/BANDAGES/DRESSINGS) IMPLANT
GLOVE BIO SURGEON STRL SZ7.5 (GLOVE) ×1 IMPLANT
GLOVE INDICATOR 8.0 STRL GRN (GLOVE) ×1 IMPLANT
GOWN STRL REUS W/ TWL XL LVL3 (GOWN DISPOSABLE) ×3 IMPLANT
GOWN STRL REUS W/TWL XL LVL3 (GOWN DISPOSABLE) ×3
GRASPER SUT TROCAR 14GX15 (MISCELLANEOUS) ×1 IMPLANT
IRRIG SUCT STRYKERFLOW 2 WTIP (MISCELLANEOUS) ×1
IRRIGATION SUCT STRKRFLW 2 WTP (MISCELLANEOUS) ×1 IMPLANT
KIT BASIN OR (CUSTOM PROCEDURE TRAY) ×1 IMPLANT
KIT TURNOVER KIT A (KITS) IMPLANT
MARKER SKIN DUAL TIP RULER LAB (MISCELLANEOUS) ×1 IMPLANT
MAT PREVALON FULL STRYKER (MISCELLANEOUS) ×1 IMPLANT
NDL SPNL 22GX3.5 QUINCKE BK (NEEDLE) ×1 IMPLANT
NEEDLE SPNL 22GX3.5 QUINCKE BK (NEEDLE) ×1 IMPLANT
PACK UNIVERSAL I (CUSTOM PROCEDURE TRAY) ×1 IMPLANT
PENCIL SMOKE EVACUATOR (MISCELLANEOUS) IMPLANT
RELOAD STAPLE 60 3.6 BLU REG (STAPLE) ×1 IMPLANT
RELOAD STAPLE 60 3.8 GOLD REG (STAPLE) IMPLANT
RELOAD STAPLE 60 4.1 GRN THCK (STAPLE) ×1 IMPLANT
RELOAD STAPLE 60 BLK VRY/THCK (STAPLE) IMPLANT
RELOAD STAPLER 60MM BLK (STAPLE) IMPLANT
RELOAD STAPLER BLUE 60MM (STAPLE) ×4 IMPLANT
RELOAD STAPLER GOLD 60MM (STAPLE) ×1 IMPLANT
RELOAD STAPLER GREEN 60MM (STAPLE) ×1 IMPLANT
SCISSORS LAP 5X45 EPIX DISP (ENDOMECHANICALS) IMPLANT
SEALANT SURGICAL APPL DUAL CAN (MISCELLANEOUS) IMPLANT
SET TUBE SMOKE EVAC HIGH FLOW (TUBING) ×1 IMPLANT
SHEARS HARMONIC ACE PLUS 45CM (MISCELLANEOUS) ×1 IMPLANT
SLEEVE ADV FIXATION 5X100MM (TROCAR) ×2 IMPLANT
SLEEVE GASTRECTOMY 40FR VISIGI (MISCELLANEOUS) ×1 IMPLANT
SOLUTION ANTFG W/FOAM PAD STRL (MISCELLANEOUS) ×1 IMPLANT
SPIKE FLUID TRANSFER (MISCELLANEOUS) ×1 IMPLANT
STAPLE LINE REINFORCEMENT LAP (STAPLE) IMPLANT
STAPLER ECHELON BIOABSB 60 FLE (MISCELLANEOUS) IMPLANT
STAPLER ECHELON LONG 60 440 (INSTRUMENTS) ×1 IMPLANT
STAPLER RELOAD 60MM BLK (STAPLE)
STAPLER RELOAD BLUE 60MM (STAPLE) ×4
STAPLER RELOAD GOLD 60MM (STAPLE) ×1
STAPLER RELOAD GREEN 60MM (STAPLE) ×1
STRIP CLOSURE SKIN 1/2X4 (GAUZE/BANDAGES/DRESSINGS) ×1 IMPLANT
SUT MNCRL AB 4-0 PS2 18 (SUTURE) ×1 IMPLANT
SUT SURGIDAC NAB ES-9 0 48 120 (SUTURE) IMPLANT
SUT VICRYL 0 TIES 12 18 (SUTURE) ×1 IMPLANT
SYR 20ML LL LF (SYRINGE) ×1 IMPLANT
SYR 50ML LL SCALE MARK (SYRINGE) ×1 IMPLANT
SYS KII OPTICAL ACCESS 15MM (TROCAR) ×1
SYSTEM KII OPTICAL ACCESS 15MM (TROCAR) ×1 IMPLANT
TOWEL OR 17X26 10 PK STRL BLUE (TOWEL DISPOSABLE) ×1 IMPLANT
TOWEL OR NON WOVEN STRL DISP B (DISPOSABLE) ×1 IMPLANT
TROCAR ADV FIXATION 5X100MM (TROCAR) ×1 IMPLANT
TROCAR XCEL NON-BLD 5MMX100MML (ENDOMECHANICALS) ×1 IMPLANT
TROCAR Z-THREAD OPTICAL 5X100M (TROCAR) IMPLANT
TUBING CONNECTING 10 (TUBING) ×2 IMPLANT
TUBING ENDO SMARTCAP (MISCELLANEOUS) ×1 IMPLANT

## 2023-05-16 NOTE — Op Note (Signed)
05/16/2023 Richardine Service 1996/03/01 [id number]   PRE-OPERATIVE DIAGNOSIS:   Severe obesity (BMI 62)  Elevated LDL cholesterol level  Low iron  Vitamin D deficiency   POST-OPERATIVE DIAGNOSIS:  same  PROCEDURE:  Procedure(s): LAPAROSCOPIC SLEEVE GASTRECTOMY  UPPER GI ENDOSCOPY Laparoscopic bilateral TAP block  SURGEON:  Surgeon(s): Atilano Ina, MD FACS FASMBS  ASSISTANTS: Phylliss Blakes MD FACS  ANESTHESIA:   general  DRAINS: none   BOUGIE: 40 fr ViSiGi  LOCAL MEDICATIONS USED:   Exparel  EBL: minimal  SPECIMEN:  Source of Specimen:  Greater curvature of stomach  DISPOSITION OF SPECIMEN:  PATHOLOGY  COUNTS:  YES  INDICATION FOR PROCEDURE: This is a very pleasant 27 y.o.-year-old morbidly obese female who has had unsuccessful attempts for sustained weight loss. The patient presents today for a planned laparoscopic sleeve gastrectomy with upper endoscopy. We have discussed the risk and benefits of the procedure extensively preoperatively. Please see my separate notes.  PROCEDURE: After obtaining informed consent and receiving 5000 units of subcutaneous heparin, the patient was brought to the operating room at [facility] and placed supine on the operating room table. General endotracheal anesthesia was established. Sequential compression devices were placed. A orogastric tube was placed. The patient's abdomen was prepped and draped in the usual standard surgical fashion. The patient received preoperative IV antibiotic. A surgical timeout was performed. ERAS protocol used.   Access to the abdomen was achieved using a 5 mm 0 laparoscope thru a 5 mm trocar In the left upper Quadrant 2 fingerbreadths below the left subcostal margin using the Optiview technique. Pneumoperitoneum was smoothly established up to 15 mm of mercury. The laparoscope was advanced and the abdominal cavity was surveilled. The patient was then placed in reverse Trendelenburg.   A 5 mm trocar was  placed slightly above and to the left of the umbilicus under direct visualization.  The [facility] liver retractor was placed under the left lobe of the liver through a 5 mm trocar incision site in the subxiphoid position. A 5 mm trocar was placed in the lateral right upper quadrant along with a 15 mm trocar in the mid right abdomen. A final 5 mm trocar was placed in the lateral LUQ.  All under direct visualization after exparel had been infiltrated in bilateral lateral upper abdominal walls as a TAP block for postoperative pain relief.  The stomach was inspected. It was completely decompressed and the orogastric tube was removed.  There was no anterior dimple that was obviously visible. Preop upper gi showed no hiatal hernia.   We identified the pylorus and measured 6 cm proximal to the pylorus and identified an area of where we would start taking down the short gastric vessels. Harmonic scalpel was used to take down the short gastric vessels along the greater curvature of the stomach. We were able to enter the lesser sac. We continued to march along the greater curvature of the stomach taking down the short gastrics. As we approached the gastrosplenic ligament we took care in this area not to injure the spleen. We were able to take down the entire gastrosplenic ligament. We then mobilized the fundus away from the left crus of diaphragm. There were not any significant posterior gastric avascular attachments. This left the stomach completely mobilized. No vessels had been taken down along the lesser curvature of the stomach.  We then reidentified the pylorus. A 40Fr ViSiGi was then placed in the oropharynx and advanced down into the stomach and placed in the distal  antrum and positioned along the lesser curvature. It was placed under suction which secured the 40Fr ViSiGi in place along the lesser curve. Then using the Ethicon echelon 60 mm stapler with a gold load with ethicon staple line reinforcement  (ESLR), I placed a stapler along the antrum approximately 5 cm from the pylorus. The stapler was angled so that there is ample room at the angularis incisura. I then fired the first staple load after inspecting it posteriorly to ensure adequate space both anteriorly and posteriorly. At this point I still was not completely past the angularis so with a blue load with ESLR, I placed the stapler in position just inside the prior stapleline. We then rotated the stomach to insure that there was adequate anteriorly as well as posteriorly. The stapler was then fired.  At this point I continued using 60 mm blue load staple cartridges with ESLR. The echelon stapler was then repositioned with a 60 mm blue load with ESLR and we continued to march up along the ViSiGi. My assistant was holding traction along the greater curvature stomach along the cauterized short gastric vessels ensuring that the stomach was symmetrically retracted. Prior to each firing of the staple, we rotated the stomach to ensure that there is adequate stomach left.  As we approached the fundus, I used 60 mm blue cartridge with ESLR aiming  lateral to the GE junction after mobilizing some of the esophageal fat pad.  The sleeve was inspected. There is no evidence of cork screw. The staple line appeared hemostatic. The CRNA inflated the ViSiGi to the green zone and the upper abdomen was flooded with saline. There were no bubbles. The sleeve was decompressed and the ViSiGi removed.  Pneumoperitoneum was reduced to 8 mmHg so we can inspect for bleeding along the staple line.  My assistant scrubbed out and performed an upper endoscopy. The sleeve easily distended with air and the scope was easily advanced to the pylorus. There is no evidence of internal bleeding or cork screwing. There was no narrowing at the angularis. There is no evidence of bubbles. Please see her operative note for further details. The gastric sleeve was decompressed and the endoscope was  removed.  I reinspected the sleeve.  There was oozing in 1 location along the second staple line.  Using a clip applier I placed 3 clips in this area.  I placed 1 clip along the first staple line as well.  There is no other evidence of bleeding along the staple line.  The greater curvature the stomach was grasped with a laparoscopic grasper and removed from the 15 mm trocar site.  The liver retractor was removed. I then closed the 15 mm trocar site with 1 interrupted 0 Vicryl sutures through the fascia using the endoclose. The closure was viewed laparoscopically and it was airtight. Remaining Exparel was then infiltrated in the preperitoneal spaces around the trocar sites. Pneumoperitoneum was released. All trocar sites were closed with a 4-0 Monocryl in a subcuticular fashion followed by the application of steri-strips, and bandaids. The patient was extubated and taken to the recovery room in stable condition. All needle, instrument, and sponge counts were correct x2. There are no immediate complications  (0) 60 mm green with ESLR (1) 60 mm gold with ESLR (4) 60 mm blue with ESLR  PLAN OF CARE: Admit to inpatient   PATIENT DISPOSITION:  PACU - hemodynamically stable.   Delay start of Pharmacological VTE agent (>24hrs) due to surgical blood loss or risk  of bleeding:  no  [name]. Andrey Campanile, MD, FACS FASMBS General, Bariatric, & Minimally Invasive Surgery [facility], [place]

## 2023-05-16 NOTE — Anesthesia Postprocedure Evaluation (Signed)
Anesthesia Post Note  Patient: Karen Garner  Procedure(s) Performed: LAPAROSCOPIC SLEEVE GASTRECTOMY UPPER GI ENDOSCOPY     Patient location during evaluation: PACU Anesthesia Type: General Level of consciousness: awake and alert Pain management: pain level controlled Vital Signs Assessment: post-procedure vital signs reviewed and stable Respiratory status: spontaneous breathing, nonlabored ventilation, respiratory function stable and patient connected to nasal cannula oxygen Cardiovascular status: blood pressure returned to baseline and stable Postop Assessment: no apparent nausea or vomiting Anesthetic complications: no  No notable events documented.  Last Vitals:  Vitals:   05/16/23 1005 05/16/23 1015  BP: (!) 162/92   Pulse: 94   Resp: 13 16  Temp:  36.6 C  SpO2: 98%     Last Pain:  Vitals:   05/16/23 1015  TempSrc:   PainSc: Asleep                 Jabril Pursell,W. EDMOND

## 2023-05-16 NOTE — Anesthesia Procedure Notes (Signed)
Procedure Name: Intubation Date/Time: 05/16/2023 7:40 AM  Performed by: Nelle Don, CRNAPre-anesthesia Checklist: Patient identified, Emergency Drugs available, Suction available and Patient being monitored Patient Re-evaluated:Patient Re-evaluated prior to induction Oxygen Delivery Method: Circle system utilized Preoxygenation: Pre-oxygenation with 100% oxygen Induction Type: IV induction Ventilation: Mask ventilation without difficulty Laryngoscope Size: Mac and 4 Grade View: Grade I Tube type: Oral Tube size: 7.0 mm Number of attempts: 1 Airway Equipment and Method: Stylet Placement Confirmation: ETT inserted through vocal cords under direct vision, positive ETCO2 and breath sounds checked- equal and bilateral Secured at: 23 cm Tube secured with: Tape Dental Injury: Teeth and Oropharynx as per pre-operative assessment  Comments: Grade 1 view, MAC 4. Easy mask ventilation

## 2023-05-16 NOTE — Progress Notes (Signed)
PHARMACY CONSULT FOR:  Risk Assessment for Post-Discharge VTE Following Bariatric Surgery  Post-Discharge VTE Risk Assessment: This patient's probability of 30-day post-discharge VTE is increased due to the factors marked: X Sleeve gastrectomy   Liver disorder (transplant, cirrhosis, or nonalcoholic steatohepatitis)   Hx of VTE   Hemorrhage requiring transfusion   GI perforation, leak, or obstruction   ====================================================    Female    Age >/=60 years  X  BMI >/=50 kg/m2    CHF    Dyspnea at Rest    Paraplegia  X  Non-gastric-band surgery    Operation Time >/=3 hr    Return to OR     Length of Stay >/= 3 d   Hypercoagulable condition   Significant venous stasis    Predicted probability of 30-day post-discharge VTE: 0.27%  Other patient-specific factors to consider: n/a   Recommendation for Discharge: No pharmacologic prophylaxis post-discharge Follow daily and recalculate estimated 30d VTE risk if returns to OR or LOS reaches 3 days.   Karen Garner is a 27 y.o. female who underwent laparoscopic sleeve gastrectomy on 6/17   Case start: 0803 Case end: 0905   No Known Allergies  Patient Measurements: Height: 5\' 3"  (160 cm) Weight: (!) 158.1 kg (348 lb 9.6 oz) IBW/kg (Calculated) : 52.4 Body mass index is 61.75 kg/m.  No results for input(s): "WBC", "HGB", "HCT", "PLT", "APTT", "CREATININE", "LABCREA", "CREAT24HRUR", "MG", "PHOS", "ALBUMIN", "PROT", "AST", "ALT", "ALKPHOS", "BILITOT", "BILIDIR", "IBILI" in the last 72 hours. Estimated Creatinine Clearance: 156.8 mL/min (by C-G formula based on SCr of 0.82 mg/dL).  Past Medical History:  Diagnosis Date   Heart murmur     Medications Prior to Admission  Medication Sig Dispense Refill Last Dose   albuterol (VENTOLIN HFA) 108 (90 Base) MCG/ACT inhaler Inhale 1-2 puffs into the lungs every 6 (six) hours as needed for wheezing or shortness of breath.   Past Month   ondansetron (ZOFRAN)  4 MG tablet Take 1 tablet (4 mg total) by mouth every 8 (eight) hours as needed for nausea or vomiting. (Patient not taking: Reported on 04/29/2023) 12 tablet 0 Not Taking     Bernadene Person, PharmD, BCPS [phone] 05/16/2023, 1:28 PM

## 2023-05-16 NOTE — Progress Notes (Signed)
Discussed QI "Goals for Discharge" document with patient including ambulation in halls, Incentive Spirometry use every hour, and oral care.  Also discussed pain and nausea control.  Enabled or verified head of bed 30 degree alarm activated.  BSTOP education provided including BSTOP information guide, "Guide for Pain Management after your Bariatric Procedure".  Diet progression education provided including "Bariatric Surgery Post-Op Food Plan Phase 1: Liquids".  Questions addressed. Pt experiencing N/V surgeon updated.  Will continue to partner with bedside RN and follow up with patient per protocol.    Thank you,  Lubertha Basque, RN, MSN Bariatric Nurse Coordinator [phone] (office)

## 2023-05-16 NOTE — H&P (Signed)
PROVIDER: Ahmira Boisselle Sherril Cong, MD  MRN: [id number] DOB: 1995-12-30 DATE OF ENCOUNTER: 05/03/2023 Subjective  Chief Complaint: Follow-up (Pre op surgery date 05/16/23 lsg )   History of Present Illness: Karen Garner is a 27 y.o. female who is seen today for for long-term follow-up regarding her severe obesity and related comorbidities including low iron, elevated LDL, vitamin D deficiency. I initially met her in January 2024 to discuss bariatric surgery  SHe was evaluated by cardiology because we detected a little bit of a murmur and no additional workup was recommended. She denies any trips to the emergency room and hospital since I last saw her other than a trip to the emergency room at the end of May for headache which is resolved. She is getting over cold with productive cough. She was given short course prednisone. She only took it for 1 day. No fever or chills. No shortness of breath. She believes it is improving. No abdominal pain. She has attended her preop education class and has a few questions about the preop meal plan  She has received nutritional and psychological evaluation and clearance  Review of Systems: A complete review of systems was obtained from the patient. I have reviewed this information and discussed as appropriate with the patient. See HPI as well for other ROS.  ROS  Medical History: History reviewed. No pertinent past medical history.  There is no problem list on file for this patient.  Past Surgical History: Procedure Laterality Date hystersocopy with d&C 04/2019 iud removal hysteroscopy 03/09/2022   No Known Allergies  Current Outpatient Medications on File Prior to Visit Medication Sig Dispense Refill albuterol MDI, PROVENTIL, VENTOLIN, PROAIR, HFA 90 mcg/actuation inhaler TAKE 2 PUFFS INTO THE LUNGS EVERY 4 TO 6 HOURS AS NEEDED FOR WHEEZING  No current facility-administered medications on file prior to visit.  Family History Problem Relation Age  of Onset High blood pressure (Hypertension) Mother Hyperlipidemia (Elevated cholesterol) Mother Diabetes Mother Obesity Father Deep vein thrombosis (DVT or abnormal blood clot formation) Father High blood pressure (Hypertension) Sister Diabetes Sister Breast cancer Maternal Aunt   Social History  Tobacco Use Smoking Status Never Smokeless Tobacco Never   Social History  Socioeconomic History Marital status: Single Tobacco Use Smoking status: Never Smokeless tobacco: Never Substance and Sexual Activity Alcohol use: Yes Drug use: Yes  Objective:  Vitals: 05/03/23 1142 BP: 138/80 Pulse: 84 Resp: 18 Temp: 36.4 C (97.6 F) SpO2: 99% Weight: (!) 159.5 kg (351 lb 9.6 oz) Height: 160 cm (5\' 3" )  Body mass index is 62.28 kg/m.  Gen: alert, NAD, non-toxic appearing; severe obesity pupils: equal, no scleral icterus Pulm: Lungs clear to auscultation, symmetric chest rise CV: regular rate and rhythm Abd: soft, nontender, nondistended. Ext: no edema, Skin: no rash, no jaundice  Labs, Imaging and Diagnostic Testing:  Cards note 12/24/22; nml echo 01/18/23 Upper gi 12/10/22 - wnl; cxr wnl ED note 04/26/23 headache  Assessment and Plan: Diagnoses and all orders for this visit:  Severe obesity (CMS/HHS-HCC)  Elevated LDL cholesterol level  Low iron  Vitamin D deficiency - ergocalciferol, vitamin D2, 1,250 mcg (50,000 unit) capsule; Take 1 capsule (50,000 Units total) by mouth once a week for 56 days    We reviewed her preoperative workup. She never got treatment for her vitamin D deficiency so I gave her prescription for vitamin D supplementation. We reviewed her chest x-ray, EKG and upper GI. She has attended her preoperative education class. I rediscussed the typical hospitalization and recovery. She has  a trip to [place] scheduled 30 days or more after her surgery. I told her that should be okay assuming she is doing well without any postoperative issues. I told  her she would still be transitioning from liquids to solids her still need to take liquid protein shakes with her. I rediscussed the steps of surgery. She read over the surgical consent form and signed. We discussed the importance of the preoperative meal plan. I went over it in detail with her. I gave her some suggestions. She has also given 2 samples of a 4 pack of Ensure max. All questions asked and answered. I believe she will be over her cold and time for surgery. I did advise no more steroids.  This patient encounter took 30 minutes today to perform the following: take history, perform exam, review outside records, interpret imaging, counsel the patient on their diagnosis and document encounter, findings & plan in the EHR  No follow-ups on file.  [name]. Andrey Campanile MD FACS General, Minimally Invasive, & Bariatric Surgery  Electronically signed by Gara Kroner, MD at 05/03/2023 12:10 PM EDT

## 2023-05-16 NOTE — Op Note (Signed)
Preoperative diagnosis: laparoscopic sleeve gastrectomy  Postoperative diagnosis: Same   Procedure: Upper endoscopy   Surgeon: Berna Bue, M.D.  Anesthesia: Gen.   Description of procedure: The endoscope was placed in the mouth and oropharynx and under endoscopic vision it was advanced to the esophagogastric junction which was identified at 38cm from the teeth.  The sleeve was tensely insufflated while the upper abdomen was flooded with irrigation to perform a leak test, which was negative. No bubbles were seen.  The staple line was hemostatic and the lumen was evenly tubular without undue narrowing, angulation or twisting specifically at the incisura angularis. The lumen was decompressed and the scope was withdrawn without difficulty.    Berna Bue, M.D. General, Bariatric, & Minimally Invasive Surgery [facility] [place]

## 2023-05-16 NOTE — Transfer of Care (Signed)
Immediate Anesthesia Transfer of Care Note  Patient: Karen Garner  Procedure(s) Performed: LAPAROSCOPIC SLEEVE GASTRECTOMY UPPER GI ENDOSCOPY  Patient Location: PACU  Anesthesia Type:General  Level of Consciousness: drowsy  Airway & Oxygen Therapy: Patient Spontanous Breathing and Patient connected to face mask oxygen  Post-op Assessment: Report given to RN, Post -op Vital signs reviewed and stable, and Patient moving all extremities X 4  Post vital signs: Reviewed and stable  Last Vitals:  Vitals Value Taken Time  BP 150/91   Temp    Pulse 94 05/16/23 0911  Resp 32 05/16/23 0911  SpO2 96 % 05/16/23 0911  Vitals shown include unvalidated device data.  Last Pain:  Vitals:   05/16/23 0546  TempSrc: Oral  PainSc:          Complications: No notable events documented.

## 2023-05-16 NOTE — Interval H&P Note (Signed)
History and Physical Interval Note:  05/16/2023 7:26 AM  Karen Garner  has presented today for surgery, with the diagnosis of MORBID OBESITY.  The various methods of treatment have been discussed with the patient and family. After consideration of risks, benefits and other options for treatment, the patient has consented to  Procedure(s): LAPAROSCOPIC SLEEVE GASTRECTOMY (N/A) UPPER GI ENDOSCOPY (N/A) as a surgical intervention.  The patient's history has been reviewed, patient examined, no change in status, stable for surgery.  I have reviewed the patient's chart and labs.  Questions were answered to the patient's satisfaction.    [name]. Andrey Campanile, MD, FACS General, Bariatric, & Minimally Invasive Surgery [facility],  [facility]  Gaynelle Adu

## 2023-05-17 ENCOUNTER — Encounter (HOSPITAL_COMMUNITY): Payer: Self-pay | Admitting: General Surgery

## 2023-05-17 LAB — COMPREHENSIVE METABOLIC PANEL
ALT: 25 U/L (ref 0–44)
AST: 21 U/L (ref 15–41)
Albumin: 3.5 g/dL (ref 3.5–5.0)
Alkaline Phosphatase: 53 U/L (ref 38–126)
Anion gap: 6 (ref 5–15)
BUN: 7 mg/dL (ref 6–20)
CO2: 22 mmol/L (ref 22–32)
Calcium: 8.6 mg/dL — ABNORMAL LOW (ref 8.9–10.3)
Chloride: 108 mmol/L (ref 98–111)
Creatinine, Ser: 0.74 mg/dL (ref 0.44–1.00)
GFR, Estimated: >60 mL/min (ref >60)
Glucose, Bld: 102 mg/dL — ABNORMAL HIGH (ref 70–99)
Potassium: 3.8 mmol/L (ref 3.5–5.1)
Sodium: 136 mmol/L (ref 135–145)
Total Bilirubin: 0.6 mg/dL (ref 0.3–1.2)
Total Protein: 7.3 g/dL (ref 6.5–8.1)

## 2023-05-17 LAB — CBC WITH DIFFERENTIAL/PLATELET
Abs Immature Granulocytes: 0.02 10*3/uL (ref 0.00–0.07)
Basophils Absolute: 0 10*3/uL (ref 0.0–0.1)
Basophils Relative: 0 %
Eosinophils Absolute: 0 10*3/uL (ref 0.0–0.5)
Eosinophils Relative: 0 %
HCT: 34.4 % — ABNORMAL LOW (ref 36.0–46.0)
Hemoglobin: 10.7 g/dL — ABNORMAL LOW (ref 12.0–15.0)
Immature Granulocytes: 0 %
Lymphocytes Relative: 17 %
Lymphs Abs: 1.3 10*3/uL (ref 0.7–4.0)
MCH: 24.7 pg — ABNORMAL LOW (ref 26.0–34.0)
MCHC: 31.1 g/dL (ref 30.0–36.0)
MCV: 79.3 fL — ABNORMAL LOW (ref 80.0–100.0)
Monocytes Absolute: 0.7 10*3/uL (ref 0.1–1.0)
Monocytes Relative: 9 %
Neutro Abs: 5.9 10*3/uL (ref 1.7–7.7)
Neutrophils Relative %: 74 %
Platelets: 464 10*3/uL — ABNORMAL HIGH (ref 150–400)
RBC: 4.34 MIL/uL (ref 3.87–5.11)
RDW: 14 % (ref 11.5–15.5)
WBC: 8 10*3/uL (ref 4.0–10.5)
nRBC: 0 % (ref 0.0–0.2)

## 2023-05-17 LAB — SURGICAL PATHOLOGY

## 2023-05-17 NOTE — Progress Notes (Signed)
1 Day Post-Op   Subjective/Chief Complaint: Some nausea but feels it with oral intake Family member at bedside when I saw this am.    Objective: Vital signs in last 24 hours: Temp:  [98.5 F (36.9 C)-99.8 F (37.7 C)] 98.6 F (37 C) (06/18 1354) Pulse Rate:  [82-101] 82 (06/18 1354) Resp:  [15-18] 18 (06/18 1354) BP: (133-158)/(65-85) 139/76 (06/18 1354) SpO2:  [95 %-97 %] 96 % (06/18 1354) Last BM Date : 05/15/23  Intake/Output from previous day: 06/17 0701 - 06/18 0700 In: 3287 [P.O.:180; I.V.:2907; IV Piggyback:200] Out: 1915 [Urine:1900; Blood:15] Intake/Output this shift: Total I/O In: 1646.2 [P.O.:300; I.V.:1346.2] Out: 1400 [Urine:1400]  Alert, nontoxic Nonlabored Reg Soft, expected mild TTP, incisions ok +SCDs  Lab Results:  Recent Labs    05/16/23 1435 05/17/23 0441  WBC  --  8.0  HGB 11.7* 10.7*  HCT 38.1 34.4*  PLT  --  464*   BMET Recent Labs    05/17/23 0441  NA 136  K 3.8  CL 108  CO2 22  GLUCOSE 102*  BUN 7  CREATININE 0.74  CALCIUM 8.6*   PT/INR No results for input(s): "LABPROT", "INR" in the last 72 hours. ABG No results for input(s): "PHART", "HCO3" in the last 72 hours.  Invalid input(s): "PCO2", "PO2"  Studies/Results: No results found.  Anti-infectives: Anti-infectives (From admission, onward)    Start     Dose/Rate Route Frequency Ordered Stop   05/16/23 0600  cefoTEtan (CEFOTAN) 2 g in sodium chloride 0.9 % 100 mL IVPB        2 g 200 mL/hr over 30 Minutes Intravenous On call to O.R. 05/16/23 0540 05/16/23 1610       Assessment/Plan: s/p Procedure(s): LAPAROSCOPIC SLEEVE GASTRECTOMY (N/A) UPPER GI ENDOSCOPY (N/A)  No clinical signs of leak Cont chemical vte prophylaxis Given concern about pt's ambulation after discharge will plan on sending out on 2 weeks of bid prophylactic lovenox -d/w with pharm Pt has not met criteria for safe dc today - hasn't meet fluid goals by this time. Will keep overnight.  Should  be able to go home tomorrow   LOS: 1 day    Gaynelle Adu 05/17/2023

## 2023-05-17 NOTE — Discharge Instructions (Signed)

## 2023-05-17 NOTE — Progress Notes (Signed)
Patient alert and oriented, pain is controlled. Patient is tolerating fluids, advanced to protein shake today, patient is tolerating well. Reviewed Gastric sleeve/bypass discharge instructions with patient and patient is able to articulate understanding. Provided information on BELT program, Support Group, BSTOP-D, and [facility] outpatient pharmacy. Communicated general update of patient status to surgeon. All questions answered. 24hr fluid recall is 300 mL per hydration protocol, bariatric nurse coordinator to make follow-up phone call within one week.

## 2023-05-18 LAB — CBC WITH DIFFERENTIAL/PLATELET
Abs Immature Granulocytes: 0.02 10*3/uL (ref 0.00–0.07)
Basophils Absolute: 0 10*3/uL (ref 0.0–0.1)
Basophils Relative: 0 %
Eosinophils Absolute: 0 10*3/uL (ref 0.0–0.5)
Eosinophils Relative: 0 %
HCT: 34.5 % — ABNORMAL LOW (ref 36.0–46.0)
Hemoglobin: 10.4 g/dL — ABNORMAL LOW (ref 12.0–15.0)
Immature Granulocytes: 0 %
Lymphocytes Relative: 27 %
Lymphs Abs: 1.7 10*3/uL (ref 0.7–4.0)
MCH: 24.4 pg — ABNORMAL LOW (ref 26.0–34.0)
MCHC: 30.1 g/dL (ref 30.0–36.0)
MCV: 80.8 fL (ref 80.0–100.0)
Monocytes Absolute: 0.6 10*3/uL (ref 0.1–1.0)
Monocytes Relative: 10 %
Neutro Abs: 4.1 10*3/uL (ref 1.7–7.7)
Neutrophils Relative %: 63 %
Platelets: 433 10*3/uL — ABNORMAL HIGH (ref 150–400)
RBC: 4.27 MIL/uL (ref 3.87–5.11)
RDW: 14.4 % (ref 11.5–15.5)
WBC: 6.4 10*3/uL (ref 4.0–10.5)
nRBC: 0 % (ref 0.0–0.2)

## 2023-05-18 MED ORDER — TRAMADOL HCL 50 MG PO TABS
50.0000 mg | ORAL_TABLET | Freq: Four times a day (QID) | ORAL | 0 refills | Status: DC | PRN
Start: 2023-05-18 — End: 2023-11-01

## 2023-05-18 MED ORDER — PANTOPRAZOLE SODIUM 40 MG PO TBEC: 40.0000 mg | DELAYED_RELEASE_TABLET | Freq: Every day | ORAL | 0 refills | Status: AC

## 2023-05-18 MED ORDER — ENOXAPARIN (LOVENOX) PATIENT EDUCATION KIT
PACK | Freq: Once | Status: DC
  Filled 2023-05-18: qty 1

## 2023-05-18 MED ORDER — ENOXAPARIN SODIUM 60 MG/0.6ML IJ SOSY: 60.0000 mg | PREFILLED_SYRINGE | Freq: Two times a day (BID) | INTRAMUSCULAR | 0 refills | Status: DC

## 2023-05-18 MED ORDER — ONDANSETRON 4 MG PO TBDP
4.0000 mg | ORAL_TABLET | Freq: Four times a day (QID) | ORAL | 0 refills | Status: DC | PRN
Start: 2023-05-18 — End: 2023-11-01

## 2023-05-18 MED ORDER — ACETAMINOPHEN 500 MG PO TABS: 1000.0000 mg | ORAL_TABLET | Freq: Three times a day (TID) | ORAL | 0 refills | Status: AC

## 2023-05-18 NOTE — Progress Notes (Signed)
DC information and lovenox kit was discussed with patient. All questions were answered and patient was taken to the main entrance via wheelchair.

## 2023-05-18 NOTE — Discharge Summary (Signed)
Physician Discharge Summary  Karen Garner ZOX:[id number] DOB: 01-25-1996 DOA: 05/16/2023  PCP: Oval Linsey, PA-C  Admit date: 05/16/2023 Discharge date: 05/18/2023  Recommendations for Outpatient Follow-up:    Follow-up Information     Gaynelle Adu, MD Follow up.   Specialty: General Surgery Contact information: [address] [place] [address] [phone]                Discharge Diagnoses:  Principal Problem:   S/P laparoscopic sleeve gastrectomy Severe obesity (BMI 62)  Elevated LDL cholesterol level  Low iron  Vitamin D deficiency   Surgical Procedure: Laparoscopic Sleeve Gastrectomy, upper endoscopy  Discharge Condition: Good Disposition: Home  Diet recommendation: Postoperative sleeve gastrectomy diet (liquids only)  Filed Weights   05/16/23 0546  Weight: (!) 158.1 kg     Hospital Course:  The patient was admitted for a planned laparoscopic sleeve gastrectomy. Please see operative note. Preoperatively the patient was given 5000 units of subcutaneous heparin for DVT prophylaxis. Postoperative prophylactic heparin dosing was started on the evening of postoperative day 0. ERAS protocol was used. On the evening of postoperative day 0, the patient was started on water and ice chips. On postoperative day 1 the patient had no fever or tachycardia and was tolerating water in their diet was gradually advanced throughout the day. However her oral intake did not meet criteria for discharge. On pod 2 her oral intake was much improved and was felt stable for dc. The patient was ambulating without difficulty. Their vital signs are stable without fever or tachycardia. Their hemoglobin had remained stable. The patient had received discharge instructions and counseling. They were deemed stable for discharge and had met discharge criteria  Pt sent out on 2 weeks of vte prophylaxis of lovenox 60mg  subcu bid   BP (!) 151/88 (BP Location: Left Arm)   Pulse 63    Temp 98.4 F (36.9 C) (Oral)   Resp 18   Ht 5\' 3"  (1.6 m)   Wt (!) 158.1 kg   LMP 04/04/2023 (Exact Date)   SpO2 99%   BMI 61.75 kg/m   Gen: alert, NAD, non-toxic appearing Pupils: equal, no scleral icterus Pulm: Lungs clear to auscultation, symmetric chest rise CV: regular rate and rhythm Abd: soft, min tender, nondistended.  No cellulitis. No incisional hernia Ext: no edema, no calf tenderness Skin: no rash, no jaundice    Discharge Instructions  Discharge Instructions     Ambulate hourly while awake   Complete by: As directed    Call MD for:  difficulty breathing, headache or visual disturbances   Complete by: As directed    Call MD for:  persistant dizziness or light-headedness   Complete by: As directed    Call MD for:  persistant nausea and vomiting   Complete by: As directed    Call MD for:  redness, tenderness, or signs of infection (pain, swelling, redness, odor or green/yellow discharge around incision site)   Complete by: As directed    Call MD for:  severe uncontrolled pain   Complete by: As directed    Call MD for:  temperature >101 F   Complete by: As directed    Diet bariatric full liquid   Complete by: As directed    Discharge instructions   Complete by: As directed    See bariatric discharge instructions   Incentive spirometry   Complete by: As directed    Perform hourly while awake      Allergies as of  05/18/2023   No Known Allergies      Medication List     STOP taking these medications    ondansetron 4 MG tablet Commonly known as: ZOFRAN       TAKE these medications    acetaminophen 500 MG tablet Commonly known as: TYLENOL Take 2 tablets (1,000 mg total) by mouth every 8 (eight) hours for 5 days.   albuterol 108 (90 Base) MCG/ACT inhaler Commonly known as: VENTOLIN HFA Inhale 1-2 puffs into the lungs every 6 (six) hours as needed for wheezing or shortness of breath.   enoxaparin 60 MG/0.6ML injection Commonly known as:  LOVENOX Inject 0.6 mLs (60 mg total) into the skin every 12 (twelve) hours for 14 days.   ondansetron 4 MG disintegrating tablet Commonly known as: ZOFRAN-ODT Take 1 tablet (4 mg total) by mouth every 6 (six) hours as needed for nausea or vomiting.   pantoprazole 40 MG tablet Commonly known as: PROTONIX Take 1 tablet (40 mg total) by mouth daily.   traMADol 50 MG tablet Commonly known as: ULTRAM Take 1 tablet (50 mg total) by mouth every 6 (six) hours as needed (pain).        Follow-up Information     Gaynelle Adu, MD Follow up.   Specialty: General Surgery Contact information: [address] [place] [address] [phone]                  The results of significant diagnostics from this hospitalization (including imaging, microbiology, ancillary and laboratory) are listed below for reference.    Significant Diagnostic Studies: No results found.  Labs: Basic Metabolic Panel: Recent Labs  Lab 05/17/23 0441  NA 136  K 3.8  CL 108  CO2 22  GLUCOSE 102*  BUN 7  CREATININE 0.74  CALCIUM 8.6*   Liver Function Tests: Recent Labs  Lab 05/17/23 0441  AST 21  ALT 25  ALKPHOS 53  BILITOT 0.6  PROT 7.3  ALBUMIN 3.5    CBC: Recent Labs  Lab 05/16/23 1435 05/17/23 0441 05/18/23 0459  WBC  --  8.0 6.4  NEUTROABS  --  5.9 4.1  HGB 11.7* 10.7* 10.4*  HCT 38.1 34.4* 34.5*  MCV  --  79.3* 80.8  PLT  --  464* 433*    CBG: No results for input(s): "GLUCAP" in the last 168 hours.  Principal Problem:   S/P laparoscopic sleeve gastrectomy   Time coordinating discharge: 15 min  Signed:  Atilano Ina, MD [facility] [phone] 05/18/2023, 10:24 AM

## 2023-05-18 NOTE — Progress Notes (Signed)
VTE Prophylaxis handout provided to patient. Patient receptive.

## 2023-05-18 NOTE — TOC CM/SW Note (Signed)
Transition of Care [facility]) - Inpatient Brief Assessment  Patient Details  Name: Karen Garner MRN: [id number] Date of Birth: 01-31-1996  Transition of Care [facility]) CM/SW Contact:    Ewing Schlein, LCSW Phone Number: 05/18/2023, 12:17 PM  Clinical Narrative: Screening completed. No TOC needs identified.  Transition of Care Asessment: Insurance and Status: Insurance coverage has been reviewed Patient has primary care physician: Yes Home environment has been reviewed: Patient lives alone in an apartment Prior level of function:: Independent at baseline Prior/Current Home Services: No current home services Social Determinants of Health Reivew: SDOH reviewed no interventions necessary Readmission risk has been reviewed: Yes Transition of care needs: no transition of care needs at this time

## 2023-05-26 ENCOUNTER — Telehealth (HOSPITAL_COMMUNITY): Payer: Self-pay | Admitting: *Deleted

## 2023-05-26 NOTE — Telephone Encounter (Signed)
1. Tell me about your pain and pain management?    Pt denies any pain.    2. Let's talk about fluid intake. How much total fluid are you taking in?   Pt states that s/he is getting in at least 45oz of fluid including protein shakes, bottled water, and soup   Pt encouraged to continue to work towards meeting goal. Pt instructed to assess status and suggestions daily utilizing Hydration Action Plan on discharge folder and to call [facility] if in the "red zone".     3. How much protein have you taken in the last day?     Pt  is working to meet the goal of 60g of protein today. Pt plans to drink the reminder of goal throughout the day to meet criteria.   4. Have you had nausea? Tell me about when you have experienced nausea and what you did to help?   Pt denies nausea.   5. Has the frequency or color changed with your urine?   Pt states that s/he is urinating "fine" with no changes in frequency or urgency.   6. Tell me what your incisions look like?   "Incisions look fine". Pt denies a fever, chills. Pt states incisions are not swollen, open, or draining. Pt encouraged to call [facility] if incisions change.     7. Have you been passing gas? BM?   Pt states that they are having BMs. Pt instructed to take either Miralax or MoM as instructed per "Gastric Bypass/Sleeve Discharge Home Care Instructions". Pt to call surgeon's office if not able to have BM with medication.    8. If a problem or question were to arise who would you call? Do you know contact numbers for [facility], [facility], and [facility]?   Pt knows to call [facility] for surgical, [facility] for nutrition, and [facility] for non-urgent questions or concerns. Pt denies dehydration symptoms. Pt can describe s/sx of dehydration.   9. How has the walking going?   Pt states s/he is walking around and able to be active without difficulty.   10. Are you still using your incentive spirometer? If so, how often?   Pt states that s/he is doing the I.S. Pt encouraged to use  incentive spirometer, at least 10x every hour while awake until s/he sees the surgeon.   11. How are your vitamins and calcium going? How are you taking them?     Pt states that s/he is taking his/her supplements and vitamins without difficulty.     12. How has the anticoagulant Lovenox been going?   LOVENOX: Pt states that s/he is taking the Lovenox injections without difficulty. Reinforced education about taking injections q12h and rotating injection sites. Pt also instructed to monitor for unusual bruising and/or signs of bleeding.

## 2023-05-31 ENCOUNTER — Encounter: Payer: Self-pay | Admitting: Dietician

## 2023-05-31 ENCOUNTER — Encounter: Payer: 59 | Attending: General Surgery | Admitting: Dietician

## 2023-05-31 VITALS — Ht 63.0 in | Wt 323.8 lb

## 2023-05-31 DIAGNOSIS — E669 Obesity, unspecified: Secondary | ICD-10-CM | POA: Diagnosis present

## 2023-05-31 NOTE — Progress Notes (Signed)
2 Week Post-Operative Nutrition Class   Patient was seen on 05/31/2023 for Post-Operative Nutrition education at the [facility].    Surgery date: 05/16/2023 Surgery type: Sleeve Gastrectomy  Anthropometrics  Start weight at [facility]: 341.8 lbs (date: 12/20/2022)  Height: 63 in Weight today: 323.8 lbs. BMI: 57.36 kg/m2     Clinical  Medical hx: N/A Medications: N/A  Labs: vitamin D 15.2, MCV 80.1, MCH 24.8, MCHC 31 Notable signs/symptoms: None stated Any previous deficiencies? No Bowel Habits: Every day to every other day no complaints   The following the learning objectives were met by the patient during this course: Identifies Soft Prepped Plan Advancement Guide  Identifies Soft, High Proteins (Phase 1), beginning 2 weeks post-operatively to 3 weeks post-operatively Identifies Additional Soft High Proteins, soft non-starchy vegetables, fruits and starches (Phase 2), beginning 3 weeks post-operatively to 3 months post-operatively Identifies appropriate sources of fluids, proteins, vegetables, fruits and starches Identifies appropriate fat sources and healthy verses unhealthy fat types   States protein, vegetable, fruit and starch recommendations and appropriate sources post-operatively Identifies the need for appropriate texture modifications, mastication, and bite sizes when consuming solids Identifies appropriate fat consumption and sources Identifies appropriate multivitamin and calcium sources post-operatively Describes the need for physical activity post-operatively and will follow MD recommendations States when to call healthcare provider regarding medication questions or post-operative complications   Handouts given during class include: Soft Prepped Plan Advancement Guide   Follow-Up Plan: Patient will follow-up at [facility] in 10 weeks for 3 month post-op nutrition visit for diet advancement per MD.

## 2023-06-09 ENCOUNTER — Telehealth: Payer: Self-pay | Admitting: Dietician

## 2023-06-09 NOTE — Telephone Encounter (Signed)
RD called pt to verify fluid intake once starting soft, solid proteins 2 week post-bariatric surgery.   Daily Fluid intake:  Daily Protein intake:  Bowel Habits:   Concerns/issues:   Not able to leave message; mailbox is full

## 2023-08-18 ENCOUNTER — Ambulatory Visit: Payer: 59 | Admitting: Dietician

## 2023-09-05 ENCOUNTER — Other Ambulatory Visit: Payer: Self-pay | Admitting: Optometry

## 2023-09-05 DIAGNOSIS — H4710 Unspecified papilledema: Secondary | ICD-10-CM

## 2023-09-06 NOTE — Progress Notes (Signed)
[facility] - Clinic Note  09/07/2023   CHIEF COMPLAINT Patient presents for Retina Evaluation  HISTORY OF PRESENT ILLNESS: Karen Garner is a 27 y.o. female who presents to the clinic today for:  HPI     Retina Evaluation   In both eyes.  I, the attending physician,  performed the HPI with the patient and updated documentation appropriately.        Comments   Patient here for Retina Evaluation. Referred by Dr Bascom Levels. Patient states vision doing fine. Got a RX for glasses. No eye pain but it light sensitive. Dr Bascom Levels saw a hole in OD. Not using drops.       Last edited by Rennis Chris, MD on 09/07/2023 11:51 AM.    Pt is here on the referral of Dr. Bascom Levels for retinal hole OD, pt states she went to see him bc she was having trouble with her eyes, she states light is bothering her, she looks at the computer a lot for work, she states she went to her PCP and they asked when her last eye exam was and sent her to Dr. Bascom Levels, pt had bariatric sx on June 17th, pt states she doesn't normally have headaches, but reports some ?episodes of whooshing in ears. Denies positional headaches, transient visual obscurations, diplopia  Referring physician: Fraz[place], [place], OD [address] [place],  [place] [address]  HISTORICAL INFORMATION:  Selected notes from the MEDICAL RECORD NUMBER Referred by Dr. Bascom Levels for concern of retinal tear LEE:  Ocular Hx- PMH-   CURRENT MEDICATIONS: Current Outpatient Medications (Ophthalmic Drugs)  Medication Sig   prednisoLONE acetate (PRED FORTE) 1 % ophthalmic suspension Place 1 drop into the right eye 4 (four) times daily for 7 days.   No current facility-administered medications for this visit. (Ophthalmic Drugs)   Current Outpatient Medications (Other)  Medication Sig   albuterol (VENTOLIN HFA) 108 (90 Base) MCG/ACT inhaler Inhale 1-2 puffs into the lungs every 6 (six) hours as needed for wheezing or shortness of  breath.   pantoprazole (PROTONIX) 40 MG tablet Take 1 tablet (40 mg total) by mouth daily.   enoxaparin (LOVENOX) 60 MG/0.6ML injection Inject 0.6 mLs (60 mg total) into the skin every 12 (twelve) hours for 14 days.   ondansetron (ZOFRAN-ODT) 4 MG disintegrating tablet Take 1 tablet (4 mg total) by mouth every 6 (six) hours as needed for nausea or vomiting. (Patient not taking: Reported on 09/07/2023)   traMADol (ULTRAM) 50 MG tablet Take 1 tablet (50 mg total) by mouth every 6 (six) hours as needed (pain). (Patient not taking: Reported on 09/07/2023)   No current facility-administered medications for this visit. (Other)   REVIEW OF SYSTEMS: ROS   Positive for: Eyes Last edited by Laddie Aquas, COA on 09/07/2023  8:17 AM.     ALLERGIES No Known Allergies PAST MEDICAL HISTORY Past Medical History:  Diagnosis Date   Heart murmur    Past Surgical History:  Procedure Laterality Date   IUD REMOVAL     LAPAROSCOPIC GASTRIC SLEEVE RESECTION N/A 05/16/2023   Procedure: LAPAROSCOPIC SLEEVE GASTRECTOMY;  Surgeon: Gaynelle Adu, MD;  Location: [facility];  Service: General;  Laterality: N/A;   TONSILLECTOMY     UPPER GI ENDOSCOPY N/A 05/16/2023   Procedure: UPPER GI ENDOSCOPY;  Surgeon: Gaynelle Adu, MD;  Location: [facility];  Service: General;  Laterality: N/A;   FAMILY HISTORY Family History  Problem Relation Age of Onset   Hypertension Father  SOCIAL HISTORY Social History   Tobacco Use   Smoking status: Never   Smokeless tobacco: Never  Vaping Use   Vaping status: Never Used  Substance Use Topics   Alcohol use: Yes    Comment: socially   Drug use: Not Currently    Types: Marijuana       OPHTHALMIC EXAM:  Base Eye Exam     Visual Acuity (Snellen - Linear)       Right Left   Dist Katherine 20/20 20/20         Tonometry (Tonopen, 9:21 AM)       Right Left   Pressure 15 14         Pupils       Dark Light Shape React APD   Right 3 2 Round Brisk None   Left 3 2 Round  Brisk None         Visual Fields (Counting fingers)       Left Right    Full Full         Extraocular Movement       Right Left    Full, Ortho Full, Ortho         Neuro/Psych     Oriented x3: Yes   Mood/Affect: Normal         Dilation     Both eyes: 1.0% Mydriacyl, 2.5% Phenylephrine @ 9:21 AM           Slit Lamp and Fundus Exam     Slit Lamp Exam       Right Left   Lids/Lashes Normal Normal   Conjunctiva/Sclera mild melanosis mild melanosis   Cornea Clear trace PEE inferiorly   Anterior Chamber deep and clear deep and clear   Iris Round and dilated Round and dilated   Lens Clear Clear   Anterior Vitreous mild syneresis mild syneresis         Fundus Exam       Right Left   Disc Pink and Sharp, mild elevation / edema Pink and Sharp, mild elevation / edema   C/D Ratio 0.1 0.1   Macula Flat, Good foveal reflex, No heme or edema Flat, Good foveal reflex, No heme or edema   Vessels Normal Normal   Periphery Attached, focal lattice with VR tuft at 1200, atrophic hole with surrounding SRF at 0100, lattice from [phone], focal patch of lattice at 1030 (almost to ora) Attached, lattice with VR tuft from 1200-0100, patch of lattice with multiple holes from 0500-0730           IMAGING AND PROCEDURES  Imaging and Procedures for 09/07/2023  OCT, Retina - OU - Both Eyes       Right Eye Quality was good. Central Foveal Thickness: 243. Progression has no prior data. Findings include normal foveal contour, no IRF, subretinal fluid (NFP, no IRF/SRF centrally, focal retinal break with +SRF SN periphery, mild disc edema).   Left Eye Quality was good. Central Foveal Thickness: 248. Progression has no prior data. Findings include normal foveal contour, no IRF, no SRF (Mild disc edema).   Notes *Images captured and stored on drive  Diagnosis / Impression:  OD: NFP, no IRF/SRF centrally; focal retinal break with +SRF SN periphery, mild disc edema OS: mild  disc edema; NFP, no IRF/SRF  Clinical management:  See below  Abbreviations: NFP - Normal foveal profile. CME - cystoid macular edema. PED - pigment epithelial detachment. IRF - intraretinal fluid. SRF - subretinal fluid. EZ -  ellipsoid zone. ERM - epiretinal membrane. ORA - outer retinal atrophy. ORT - outer retinal tubulation. SRHM - subretinal hyper-reflective material. IRHM - intraretinal hyper-reflective material      Repair Retinal Detach, Photocoag - OD - Right Eye       LASER PROCEDURE NOTE  Procedure:  Barrier laser retinopexy using slit lamp laser, RIGHT eye   Diagnosis:   Retinal hole w/ surrounding SRF / focal retinal detachment at 0100 oclock, RIGHT eye                     Lattice degeneration w/ atrophic holes, RIGHT eye - 1200, 0430-0700, and 1030  Surgeon: Rennis Chris, MD, PhD  Anesthesia: Topical  Informed consent obtained, operative eye marked, and time out performed prior to initiation of laser.   Laser settings:  Lumenis [id number] laser, slit lamp Lens: Mainster PRP 165 Power: 240 mW Spot size: 200 microns Duration: 30 msec  # spots: 1155  Placement of laser: Using a Mainster PRP 165 contact lens at the slit lamp, laser was placed in three confluent rows around focal RD at 0100, and around patches of lattice at 1200, from 0430-0700, and at 1030,  Complications: None.  Patient tolerated the procedure well and received written and verbal post-procedure care information/education.          ASSESSMENT/PLAN:   ICD-10-CM   1. 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 Bilateral retinal lattice degeneration  H35.413 Repair Retinal Detach, Photocoag - OD - Right Eye    CANCELED: Repair Retinal Breaks, Laser - OD - Right Eye    3. 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 Retinal detachment with single break, OD - The incidence, risk factors, and natural history of retinal tear was discussed with patient.   - Potential treatment options including laser retinopexy and cryotherapy discussed with patient. - atrophic hole with surrounding SRF at 0100 - recommend laser retinopexy OD today, 10.09.24 for focal RD and lattice, see below - pt wishes to proceed with laser - RBA of procedure discussed, questions answered - informed consent obtained and signed - see procedure note - start PF QID OD x7 days - f/u in 2-3 wks, DFE, OCT  2,3. Lattice degeneration w/ atrophic holes, both eyes - OD: focal lattice with VR tuft at 1200, lattice from [phone], focal patch of lattice at 1030 (almost to ora) - OS: lattice with VR tuft from 1200-0100, patch of lattice with multiple holes from 0500-0730 - discussed findings, prognosis, and treatment options including observation - recommend laser retinopexy OU, but OD first today, 10.09.24 along with laser above - pt wishes to proceed with laser - RBA of procedure discussed, questions answered - informed consent obtained and signed - see procedure note - start PF QID OD x7 days - f/u in 2-3 wks, sooner prn -- POV  4. Optic disc edema OU  - mild elevation of disc, no heme or obscuration of vessels  - BCVA 20/20 OU  - ?IIH  - pt reports some headaches and some "whooshing in ears" but denies positional headaches, TVOs, and diplopia  - pt is s/p  gastric sleeve bariatric surgery 6.17.24 and has already lost significant weight  - monitor    Ophthalmic Meds Ordered this visit:  Meds ordered this encounter  Medications   DISCONTD: prednisoLONE acetate (PRED FORTE) 1 % ophthalmic suspension    Sig: Place 1 drop into the right eye 4 (four) times daily for 7 days.    Dispense:  5 mL    Refill:  0   prednisoLONE acetate (PRED FORTE) 1 % ophthalmic suspension     Sig: Place 1 drop into the right eye 4 (four) times daily for 7 days.    Dispense:  5 mL    Refill:  0     Return in about 19 days (around 09/26/2023) for f/u lattice degeneration OU , DFE, OCT.  There are no Patient Instructions on file for this visit.  Explained the diagnoses, plan, and follow up with the patient and they expressed understanding.  Patient expressed understanding of the importance of proper follow up care.    This document serves as a record of services personally performed by Karie Chimera, MD, PhD. It was created on their behalf by De Blanch, an ophthalmic technician. The creation of this record is the provider's dictation and/or activities during the visit.    Electronically signed by: De Blanch, OA, 09/07/23  11:46 PM  This document serves as a record of services personally performed by Karie Chimera, MD, PhD. It was created on their behalf by Glee Arvin. Manson Passey, OA an ophthalmic technician. The creation of this record is the provider's dictation and/or activities during the visit.    Electronically signed by: Glee Arvin. Manson Passey, OA 09/07/23 11:46 PM  Karie Chimera, M.D., Ph.D. Diseases & Surgery of the Retina and Vitreous [facility] 09/07/2023  I have reviewed the above documentation for accuracy and completeness, and I agree with the above. Karie Chimera, M.D., Ph.D. 09/07/23 11:56 PM   Abbreviations: M myopia (nearsighted); A astigmatism; H hyperopia (farsighted); P presbyopia; Mrx spectacle prescription;  CTL contact lenses; OD right eye; OS left eye; OU both eyes  XT exotropia; ET esotropia; PEK punctate epithelial keratitis; PEE punctate epithelial erosions; DES dry eye syndrome; MGD meibomian gland dysfunction; ATs artificial tears; PFAT's preservative free artificial tears; NSC nuclear sclerotic cataract; PSC posterior subcapsular cataract; ERM epi-retinal membrane; PVD posterior vitreous detachment; RD retinal  detachment; DM diabetes mellitus; DR diabetic retinopathy; NPDR non-proliferative diabetic retinopathy; PDR proliferative diabetic retinopathy; CSME clinically significant macular edema; DME diabetic macular edema; dbh dot blot hemorrhages; CWS cotton wool spot; POAG primary open angle glaucoma; C/D cup-to-disc ratio; HVF humphrey visual field; GVF goldmann visual field; OCT optical coherence tomography; IOP intraocular pressure; BRVO Branch retinal vein occlusion; CRVO central retinal vein occlusion; CRAO central retinal artery occlusion; BRAO branch retinal artery occlusion; RT retinal tear; SB scleral buckle; PPV pars plana vitrectomy; VH Vitreous hemorrhage; PRP panretinal laser photocoagulation; IVK intravitreal kenalog; VMT vitreomacular traction; MH Macular hole;  NVD neovascularization of the disc; NVE neovascularization elsewhere; AREDS age related eye disease study; ARMD age related macular degeneration; POAG primary open angle glaucoma; EBMD epithelial/anterior basement membrane dystrophy; ACIOL anterior chamber intraocular lens; IOL intraocular lens; PCIOL posterior chamber intraocular lens; Phaco/IOL phacoemulsification with intraocular lens placement; PRK photorefractive keratectomy; LASIK laser assisted in situ keratomileusis; HTN hypertension; DM diabetes mellitus; COPD chronic obstructive pulmonary disease

## 2023-09-07 ENCOUNTER — Ambulatory Visit (INDEPENDENT_AMBULATORY_CARE_PROVIDER_SITE_OTHER): Payer: 59 | Admitting: Ophthalmology

## 2023-09-07 ENCOUNTER — Encounter (INDEPENDENT_AMBULATORY_CARE_PROVIDER_SITE_OTHER): Payer: Self-pay | Admitting: Ophthalmology

## 2023-09-07 DIAGNOSIS — H33011 Retinal detachment with single break, right eye: Secondary | ICD-10-CM

## 2023-09-07 DIAGNOSIS — H33323 Round hole, bilateral: Secondary | ICD-10-CM | POA: Diagnosis not present

## 2023-09-07 DIAGNOSIS — H4710 Unspecified papilledema: Secondary | ICD-10-CM

## 2023-09-07 DIAGNOSIS — H3581 Retinal edema: Secondary | ICD-10-CM

## 2023-09-07 DIAGNOSIS — H35413 Lattice degeneration of retina, bilateral: Secondary | ICD-10-CM

## 2023-09-07 MED ORDER — PREDNISOLONE ACETATE 1 % OP SUSP: 1.0000 [drp] | Freq: Four times a day (QID) | OPHTHALMIC | 0 refills | Status: AC

## 2023-09-07 MED ORDER — PREDNISOLONE ACETATE 1 % OP SUSP: 1.0000 [drp] | Freq: Four times a day (QID) | OPHTHALMIC | 0 refills | Status: DC

## 2023-09-13 ENCOUNTER — Ambulatory Visit: Payer: 59 | Admitting: Skilled Nursing Facility1

## 2023-09-19 ENCOUNTER — Other Ambulatory Visit: Payer: 59

## 2023-09-26 ENCOUNTER — Encounter (INDEPENDENT_AMBULATORY_CARE_PROVIDER_SITE_OTHER): Payer: 59 | Admitting: Ophthalmology

## 2023-09-26 DIAGNOSIS — H33011 Retinal detachment with single break, right eye: Secondary | ICD-10-CM

## 2023-09-26 DIAGNOSIS — H4710 Unspecified papilledema: Secondary | ICD-10-CM

## 2023-09-26 DIAGNOSIS — H33323 Round hole, bilateral: Secondary | ICD-10-CM

## 2023-09-26 DIAGNOSIS — H35413 Lattice degeneration of retina, bilateral: Secondary | ICD-10-CM

## 2023-09-28 NOTE — Progress Notes (Signed)
[facility] - Clinic Note  10/05/2023   CHIEF COMPLAINT Patient presents for Retina Follow Up  HISTORY OF PRESENT ILLNESS: Karen Garner is a 27 y.o. female who presents to the clinic today for:  HPI     Retina Follow Up   In both eyes.  This started 19 days ago.  Duration of 19 days.  Since onset it is stable.  I, the attending physician,  performed the HPI with the patient and updated documentation appropriately.        Comments   19 day retina follow up lattice ou and retinopexy OS pt is reporting no vision changes noticed she denies any flashes or floaters        Last edited by Rennis Chris, MD on 10/05/2023 11:48 AM.    Pt felt like her vision was blurry when reading the eye chart this morning, she just got her first pair of glasses a month ago  Referring physician: Oval Linsey, PA-C [address] [place],  [place] [address]  HISTORICAL INFORMATION:  Selected notes from the MEDICAL RECORD NUMBER Referred by Dr. Bascom Levels for concern of retinal tear LEE:  Ocular Hx- PMH-   CURRENT MEDICATIONS: No current outpatient medications on file. (Ophthalmic Drugs)   No current facility-administered medications for this visit. (Ophthalmic Drugs)   Current Outpatient Medications (Other)  Medication Sig   albuterol (VENTOLIN HFA) 108 (90 Base) MCG/ACT inhaler Inhale 1-2 puffs into the lungs every 6 (six) hours as needed for wheezing or shortness of breath.   enoxaparin (LOVENOX) 60 MG/0.6ML injection Inject 0.6 mLs (60 mg total) into the skin every 12 (twelve) hours for 14 days.   ondansetron (ZOFRAN-ODT) 4 MG disintegrating tablet Take 1 tablet (4 mg total) by mouth every 6 (six) hours as needed for nausea or vomiting. (Patient not taking: Reported on 09/07/2023)   pantoprazole (PROTONIX) 40 MG tablet Take 1 tablet (40 mg total) by mouth daily.   traMADol (ULTRAM) 50 MG tablet Take 1 tablet (50 mg total) by mouth every 6 (six) hours as needed (pain). (Patient not  taking: Reported on 09/07/2023)   No current facility-administered medications for this visit. (Other)   REVIEW OF SYSTEMS: ROS   Positive for: Eyes Last edited by Etheleen Mayhew, COT on 10/05/2023  9:55 AM.      ALLERGIES No Known Allergies PAST MEDICAL HISTORY Past Medical History:  Diagnosis Date   Heart murmur    Past Surgical History:  Procedure Laterality Date   IUD REMOVAL     LAPAROSCOPIC GASTRIC SLEEVE RESECTION N/A 05/16/2023   Procedure: LAPAROSCOPIC SLEEVE GASTRECTOMY;  Surgeon: Gaynelle Adu, MD;  Location: [facility];  Service: General;  Laterality: N/A;   TONSILLECTOMY     UPPER GI ENDOSCOPY N/A 05/16/2023   Procedure: UPPER GI ENDOSCOPY;  Surgeon: Gaynelle Adu, MD;  Location: [facility];  Service: General;  Laterality: N/A;   FAMILY HISTORY Family History  Problem Relation Age of Onset   Hypertension Father    SOCIAL HISTORY Social History   Tobacco Use   Smoking status: Never   Smokeless tobacco: Never  Vaping Use   Vaping status: Never Used  Substance Use Topics   Alcohol use: Yes    Comment: socially   Drug use: Not Currently    Types: Marijuana       OPHTHALMIC EXAM:  Base Eye Exam     Visual Acuity (Snellen - Linear)       Right Left   Dist cc  20/20 20/20    Correction: Glasses         Tonometry (Tonopen, 9:58 AM)       Right Left   Pressure 14 14         Pupils       Pupils Dark Light Shape React APD   Right PERRL 3 2 Round Brisk None   Left PERRL 3 2 Round Brisk None         Visual Fields       Left Right    Full Full         Extraocular Movement       Right Left    Full, Ortho Full, Ortho         Neuro/Psych     Oriented x3: Yes   Mood/Affect: Normal         Dilation     Left eye: 2.5% Phenylephrine @ 9:58 AM           Slit Lamp and Fundus Exam     Slit Lamp Exam       Right Left   Lids/Lashes Normal Normal   Conjunctiva/Sclera mild melanosis mild melanosis   Cornea Clear trace  PEE inferiorly   Anterior Chamber deep and clear deep and clear   Iris Round and dilated Round and dilated   Lens Clear Clear   Anterior Vitreous mild syneresis mild syneresis         Fundus Exam       Right Left   Disc Pink and Sharp, mild elevation / edema Pink and Sharp, mild elevation / edema   C/D Ratio 0.1 0.1   Macula Flat, Good foveal reflex, No heme or edema Flat, Good foveal reflex, No heme or edema   Vessels Normal Normal   Periphery Attached, focal lattice with VR tuft at 1200, atrophic hole with surrounding SRF at 0100, lattice from [phone], focal patch of lattice at 1030 (almost to ora); good laser changes surrounding all lesions Attached, 3 small retinal holes with shallow SRF at 1130, lattice with VR tuft from 1200-0100, patch of lattice with multiple holes from 0430-0800           IMAGING AND PROCEDURES  Imaging and Procedures for 10/05/2023  OCT, Retina - OU - Both Eyes       Right Eye Quality was good. Central Foveal Thickness: 253. Progression has been stable. Findings include normal foveal contour, no IRF, subretinal fluid (NFP, no IRF/SRF centrally, focal retinal break with +SRF SN periphery -- not imaged today, mild disc edema).   Left Eye Quality was good. Central Foveal Thickness: 256. Progression has been stable. Findings include normal foveal contour, no IRF, no SRF (Mild disc edema).   Notes *Images captured and stored on drive  Diagnosis / Impression:  OD: NFP, no IRF/SRF centrally; focal retinal break with +SRF SN periphery -- not imaged today, mild disc edema OS: mild disc edema; NFP, no IRF/SRF  Clinical management:  See below  Abbreviations: NFP - Normal foveal profile. CME - cystoid macular edema. PED - pigment epithelial detachment. IRF - intraretinal fluid. SRF - subretinal fluid. EZ - ellipsoid zone. ERM - epiretinal membrane. ORA - outer retinal atrophy. ORT - outer retinal tubulation. SRHM - subretinal hyper-reflective material. IRHM  - intraretinal hyper-reflective material      Repair Retinal Breaks, Laser - OS - Left Eye       LASER PROCEDURE NOTE  Procedure:  Barrier laser retinopexy using slit lamp laser, LEFT  eye   Diagnosis:   Lattice degeneration w/ atrophic holes, LEFT eye                     Patches of lattice: 1100-0130 superiorly; 1610-9604 inferiorly  Surgeon: Rennis Chris, MD, PhD  Anesthesia: Topical  Informed consent obtained, operative eye marked, and time out performed prior to initiation of laser.   Laser settings:  Lumenis [id number] laser, slit lamp Lens: Mainster PRP 165 Power: 250 mW Spot size: 200 microns Duration: 30 msec  # spots: 1087  Placement of laser: Using a Mainster PRP 165 contact lens at the slit lamp, laser was placed in three confluent rows around patches of lattice w/ atrophic holes from 1100-0130 oclock superiorly and 0430-0800 inferiorly  Complications: None.  Patient tolerated the procedure well and received written and verbal post-procedure care information/education.           ASSESSMENT/PLAN:   ICD-10-CM   1. 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 Retinal hole of both eyes  H33.323 Repair Retinal Breaks, Laser - OS - Left Eye    4. Optic disc edema  H47.10      1. Retinal detachment with single break, OD - atrophic hole with surrounding SRF at 0100 - s/p laser retinopexy OD 10.09.24 for focal RD and lattice - completed PF QID OD x7 days - f/u in 2-3 wks, DFE, OCT  2,3. Lattice degeneration w/ atrophic holes, both eyes - OD: focal lattice with VR tuft at 1200, lattice from [phone], focal patch of lattice at 1030 (almost to ora) - OS: 3 small retinal holes with shallow SRF at 1130, lattice with VR tuft from 1200-0100, patch of lattice with multiple holes from  0430-0800 - s/p laser retinopexy OD (10.09.24) -- good laser changes in place - recommend laser retinopexy OS today, 11.06.24  - pt wishes to proceed with laser - RBA of procedure discussed, questions answered - informed consent obtained and signed - see procedure note - start PF QID OS x7 days - f/u in 2-3 wks, sooner prn -- POV  4. 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    Return in about 3 weeks (around 10/26/2023) for post-laser for lattice OU, Dilated Exam, OCT.  There are no Patient Instructions on file for this visit.  Explained the diagnoses, plan, and follow up with the patient and they expressed understanding.  Patient expressed understanding of the importance of proper follow up care.    This document serves as a record of services personally performed by Karie Chimera, MD, PhD. It was created on their behalf by De Blanch, an ophthalmic technician. The creation of this record is the provider's dictation and/or activities during the visit.    Electronically signed by: De Blanch, OA, 10/06/23  2:11 PM  This document serves  as a record of services personally performed by Karie Chimera, MD, PhD. It was created on their behalf by Glee Arvin. Manson Passey, OA an ophthalmic technician. The creation of this record is the provider's dictation and/or activities during the visit.    Electronically signed by: Glee Arvin. Manson Passey, OA 10/06/23 2:11 PM  Karie Chimera, M.D., Ph.D. Diseases & Surgery of the Retina and Vitreous [facility] 10/05/2023  I have reviewed the  above documentation for accuracy and completeness, and I agree with the above. Karie Chimera, M.D., Ph.D. 10/06/23 2:13 PM   Abbreviations: M myopia (nearsighted); A astigmatism; H hyperopia (farsighted); P presbyopia; Mrx spectacle prescription;  CTL contact lenses; OD right eye; OS left eye; OU both eyes  XT exotropia; ET esotropia; PEK punctate epithelial keratitis; PEE punctate epithelial erosions; DES dry eye syndrome; MGD meibomian gland dysfunction; ATs artificial tears; PFAT's preservative free artificial tears; NSC nuclear sclerotic cataract; PSC posterior subcapsular cataract; ERM epi-retinal membrane; PVD posterior vitreous detachment; RD retinal detachment; DM diabetes mellitus; DR diabetic retinopathy; NPDR non-proliferative diabetic retinopathy; PDR proliferative diabetic retinopathy; CSME clinically significant macular edema; DME diabetic macular edema; dbh dot blot hemorrhages; CWS cotton wool spot; POAG primary open angle glaucoma; C/D cup-to-disc ratio; HVF humphrey visual field; GVF goldmann visual field; OCT optical coherence tomography; IOP intraocular pressure; BRVO Branch retinal vein occlusion; CRVO central retinal vein occlusion; CRAO central retinal artery occlusion; BRAO branch retinal artery occlusion; RT retinal tear; SB scleral buckle; PPV pars plana vitrectomy; VH Vitreous hemorrhage; PRP panretinal laser photocoagulation; IVK intravitreal kenalog; VMT vitreomacular traction; MH Macular hole;  NVD neovascularization of the disc; NVE neovascularization elsewhere; AREDS age related eye disease study; ARMD age related macular degeneration; POAG primary open angle glaucoma; EBMD epithelial/anterior basement membrane dystrophy; ACIOL anterior chamber intraocular lens; IOL intraocular lens; PCIOL posterior chamber intraocular lens; Phaco/IOL phacoemulsification with intraocular lens placement; PRK photorefractive keratectomy; LASIK laser assisted in situ keratomileusis; HTN  hypertension; DM diabetes mellitus; COPD chronic obstructive pulmonary disease

## 2023-10-05 ENCOUNTER — Encounter (INDEPENDENT_AMBULATORY_CARE_PROVIDER_SITE_OTHER): Payer: Self-pay | Admitting: Ophthalmology

## 2023-10-05 ENCOUNTER — Ambulatory Visit (INDEPENDENT_AMBULATORY_CARE_PROVIDER_SITE_OTHER): Payer: 59 | Admitting: Ophthalmology

## 2023-10-05 DIAGNOSIS — H4710 Unspecified papilledema: Secondary | ICD-10-CM

## 2023-10-05 DIAGNOSIS — H33011 Retinal detachment with single break, right eye: Secondary | ICD-10-CM

## 2023-10-05 DIAGNOSIS — H33323 Round hole, bilateral: Secondary | ICD-10-CM | POA: Diagnosis not present

## 2023-10-05 DIAGNOSIS — H35413 Lattice degeneration of retina, bilateral: Secondary | ICD-10-CM

## 2023-10-13 ENCOUNTER — Encounter: Payer: Self-pay | Admitting: Neurology

## 2023-10-20 ENCOUNTER — Other Ambulatory Visit: Payer: 59

## 2023-10-20 NOTE — Progress Notes (Signed)
[facility] - Clinic Note  10/26/2023   CHIEF COMPLAINT Patient presents for Retina Follow Up  HISTORY OF PRESENT ILLNESS: Karen Garner is a 27 y.o. female who presents to the clinic today for:  HPI     Retina Follow Up   Patient presents with  Retinal Break/Detachment.  In right eye.  This started 2 weeks ago.  Duration of 2 weeks.  Since onset it is stable.  I, the attending physician,  performed the HPI with the patient and updated documentation appropriately.        Comments   2 week retina follow up RD and lattice deg pt is reporting vision little better she denies any flashes or floaters       Last edited by Rennis Chris, MD on 10/26/2023 12:23 PM.    Pt feels like she needs a different glasses Rx, she has stopped wearing them bc her vision is better without them than with them, pt has an appt with neurology on December 3rd  Referring physician: [place], [place], OD [address] [place],  [place] [address]  HISTORICAL INFORMATION:  Selected notes from the MEDICAL RECORD NUMBER Referred by Dr. Bascom Levels for concern of retinal tear LEE:  Ocular Hx- PMH-   CURRENT MEDICATIONS: No current outpatient medications on file. (Ophthalmic Drugs)   No current facility-administered medications for this visit. (Ophthalmic Drugs)   Current Outpatient Medications (Other)  Medication Sig   albuterol (VENTOLIN HFA) 108 (90 Base) MCG/ACT inhaler Inhale 1-2 puffs into the lungs every 6 (six) hours as needed for wheezing or shortness of breath.   enoxaparin (LOVENOX) 60 MG/0.6ML injection Inject 0.6 mLs (60 mg total) into the skin every 12 (twelve) hours for 14 days.   ondansetron (ZOFRAN-ODT) 4 MG disintegrating tablet Take 1 tablet (4 mg total) by mouth every 6 (six) hours as needed for nausea or vomiting. (Patient not taking: Reported on 09/07/2023)   pantoprazole (PROTONIX) 40 MG tablet Take 1 tablet (40 mg total) by mouth daily.   traMADol (ULTRAM) 50  MG tablet Take 1 tablet (50 mg total) by mouth every 6 (six) hours as needed (pain). (Patient not taking: Reported on 09/07/2023)   No current facility-administered medications for this visit. (Other)   REVIEW OF SYSTEMS: ROS   Positive for: Eyes Last edited by Etheleen Mayhew, COT on 10/26/2023 10:15 AM.     ALLERGIES No Known Allergies PAST MEDICAL HISTORY Past Medical History:  Diagnosis Date   Heart murmur    Past Surgical History:  Procedure Laterality Date   IUD REMOVAL     LAPAROSCOPIC GASTRIC SLEEVE RESECTION N/A 05/16/2023   Procedure: LAPAROSCOPIC SLEEVE GASTRECTOMY;  Surgeon: Gaynelle Adu, MD;  Location: [facility];  Service: General;  Laterality: N/A;   TONSILLECTOMY     UPPER GI ENDOSCOPY N/A 05/16/2023   Procedure: UPPER GI ENDOSCOPY;  Surgeon: Gaynelle Adu, MD;  Location: [facility];  Service: General;  Laterality: N/A;   FAMILY HISTORY Family History  Problem Relation Age of Onset   Hypertension Father    SOCIAL HISTORY Social History   Tobacco Use   Smoking status: Never   Smokeless tobacco: Never  Vaping Use   Vaping status: Never Used  Substance Use Topics   Alcohol use: Yes    Comment: socially   Drug use: Not Currently    Types: Marijuana       OPHTHALMIC EXAM:  Base Eye Exam     Visual Acuity (Snellen - Linear)  Right Left   Dist Leonore 20/20 -1 20/20 -1         Tonometry (Tonopen, 10:18 AM)       Right Left   Pressure 12 14         Pupils       Pupils Dark Light Shape React APD   Right PERRL 3 2 Round Brisk None   Left PERRL 3 2 Round Brisk None         Visual Fields       Left Right    Full Full         Extraocular Movement       Right Left    Full, Ortho Full, Ortho         Neuro/Psych     Oriented x3: Yes   Mood/Affect: Normal         Dilation     Both eyes: 2.5% Phenylephrine @ 10:19 AM           Slit Lamp and Fundus Exam     Slit Lamp Exam       Right Left   Lids/Lashes Normal  Normal   Conjunctiva/Sclera mild melanosis mild melanosis   Cornea Clear trace PEE inferiorly   Anterior Chamber deep and clear deep and clear   Iris Round and dilated Round and dilated   Lens Clear Clear   Anterior Vitreous mild syneresis mild syneresis         Fundus Exam       Right Left   Disc Pink and Sharp, mild elevation / edema Pink and Sharp, mild elevation / edema   C/D Ratio 0.1 0.1   Macula Flat, Good foveal reflex, No heme or edema Flat, Good foveal reflex, No heme or edema   Vessels Normal Normal   Periphery Attached, focal lattice with VR tuft at 1200, atrophic hole with surrounding SRF at 0100, lattice from [phone], focal patch of lattice at 1030 (almost to ora); good laser changes surrounding all lesions Attached, 3 small retinal holes with shallow SRF at 1130, lattice with VR tuft from 1200-0100, patch of lattice with multiple holes from 0430-0800 -- good laser surrounding           IMAGING AND PROCEDURES  Imaging and Procedures for 10/26/2023  OCT, Retina - OU - Both Eyes       Right Eye Quality was good. Central Foveal Thickness: 250. Progression has been stable. Findings include normal foveal contour, no IRF, subretinal fluid (NFP, no IRF/SRF centrally, focal retinal break with +SRF SN periphery -- [facility] (laser) surrounding, mild disc edema).   Left Eye Quality was good. Central Foveal Thickness: 256. Progression has been stable. Findings include normal foveal contour, no IRF, no SRF (Persistent disc edema).   Notes *Images captured and stored on drive  Diagnosis / Impression:  OD: NFP, no IRF/SRF centrally, focal retinal break with +SRF SN periphery -- [facility] (laser) surrounding, mild disc edema OS: persistent disc edema; NFP, no IRF/SRF  Clinical management:  See below  Abbreviations: NFP - Normal foveal profile. CME - cystoid macular edema. PED - pigment epithelial detachment. IRF - intraretinal fluid. SRF - subretinal fluid. EZ - ellipsoid zone.  ERM - epiretinal membrane. ORA - outer retinal atrophy. ORT - outer retinal tubulation. SRHM - subretinal hyper-reflective material. IRHM - intraretinal hyper-reflective material           ASSESSMENT/PLAN:   ICD-10-CM   1. 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 Bilateral retinal lattice degeneration  H35.413     3. Retinal hole of both eyes  H33.323     4. Optic disc edema  H47.10 OCT, Retina - OU - Both Eyes      1. Retinal detachment with single break, OD - atrophic hole with surrounding SRF at 0100 - s/p laser retinopexy OD 10.09.24 for focal RD and lattice -- good laser in place - completed PF QID OD x7 days - no new RT/RD - f/u in 2-3 months, DFE, OCT  2,3. Lattice degeneration w/ atrophic holes, both eyes - OD: focal lattice with VR tuft at 1200, lattice from [phone], focal patch of lattice at 1030 (almost to ora) - OS: 3 small retinal holes with shallow SRF at 1130, lattice with VR tuft from 1200-0100, patch of lattice with multiple holes from 0430-0800 - s/p laser retinopexy OD (10.09.24) -- good laser changes in place - s/p laser retinopexy OS (11.06.24) -- good laser changes in place  - completed PF QID OS x7 days - no new RT/RD or lattice OU - f/u in 2-3 month, sooner prn -- POV  4. 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 Bascom Levels for further evaluation -- agree  - Neurology appointment scheduled for 12.03.24  Ophthalmic Meds Ordered this visit:  No orders of the defined types were placed in this encounter.    Return for f/u 2-3 months lattice with atrophic holes OU, DFE, OCT.  There  are no Patient Instructions on file for this visit.  Explained the diagnoses, plan, and follow up with the patient and they expressed understanding.  Patient expressed understanding of the importance of proper follow up care.   This document serves as a record of services personally performed by Karie Chimera, MD, PhD. It was created on their behalf by De Blanch, an ophthalmic technician. The creation of this record is the provider's dictation and/or activities during the visit.    Electronically signed by: De Blanch, OA, 10/26/23  12:24 PM  This document serves as a record of services personally performed by Karie Chimera, MD, PhD. It was created on their behalf by Glee Arvin. Manson Passey, OA an ophthalmic technician. The creation of this record is the provider's dictation and/or activities during the visit.    Electronically signed by: Glee Arvin. Manson Passey, OA 10/26/23 12:24 PM  Karie Chimera, M.D., Ph.D. Diseases & Surgery of the Retina and Vitreous [facility] 10/26/2023  I have reviewed the above documentation for accuracy and completeness, and I agree with the above. Karie Chimera, M.D., Ph.D. 10/26/23 12:27 PM   Abbreviations: M myopia (nearsighted); A astigmatism; H hyperopia (farsighted); P presbyopia; Mrx spectacle prescription;  CTL contact lenses; OD right eye; OS left eye; OU both eyes  XT exotropia; ET esotropia; PEK punctate epithelial keratitis; PEE punctate epithelial erosions; DES dry eye syndrome; MGD meibomian gland dysfunction; ATs artificial tears; PFAT's preservative free artificial tears; NSC nuclear sclerotic cataract; PSC posterior subcapsular cataract; ERM epi-retinal membrane; PVD posterior vitreous detachment; RD retinal detachment; DM diabetes mellitus; DR  diabetic retinopathy; NPDR non-proliferative diabetic retinopathy; PDR proliferative diabetic retinopathy; CSME clinically significant macular edema; DME diabetic macular edema; dbh  dot blot hemorrhages; CWS cotton wool spot; POAG primary open angle glaucoma; C/D cup-to-disc ratio; HVF humphrey visual field; GVF goldmann visual field; OCT optical coherence tomography; IOP intraocular pressure; BRVO Branch retinal vein occlusion; CRVO central retinal vein occlusion; CRAO central retinal artery occlusion; BRAO branch retinal artery occlusion; RT retinal tear; SB scleral buckle; PPV pars plana vitrectomy; VH Vitreous hemorrhage; PRP panretinal laser photocoagulation; IVK intravitreal kenalog; VMT vitreomacular traction; MH Macular hole;  NVD neovascularization of the disc; NVE neovascularization elsewhere; AREDS age related eye disease study; ARMD age related macular degeneration; POAG primary open angle glaucoma; EBMD epithelial/anterior basement membrane dystrophy; ACIOL anterior chamber intraocular lens; IOL intraocular lens; PCIOL posterior chamber intraocular lens; Phaco/IOL phacoemulsification with intraocular lens placement; PRK photorefractive keratectomy; LASIK laser assisted in situ keratomileusis; HTN hypertension; DM diabetes mellitus; COPD chronic obstructive pulmonary disease

## 2023-10-25 ENCOUNTER — Ambulatory Visit: Payer: 59 | Admitting: Neurology

## 2023-10-26 ENCOUNTER — Ambulatory Visit (INDEPENDENT_AMBULATORY_CARE_PROVIDER_SITE_OTHER): Payer: 59 | Admitting: Ophthalmology

## 2023-10-26 ENCOUNTER — Encounter (INDEPENDENT_AMBULATORY_CARE_PROVIDER_SITE_OTHER): Payer: Self-pay | Admitting: Ophthalmology

## 2023-10-26 DIAGNOSIS — H33011 Retinal detachment with single break, right eye: Secondary | ICD-10-CM | POA: Diagnosis not present

## 2023-10-26 DIAGNOSIS — H35413 Lattice degeneration of retina, bilateral: Secondary | ICD-10-CM

## 2023-10-26 DIAGNOSIS — H4710 Unspecified papilledema: Secondary | ICD-10-CM | POA: Diagnosis not present

## 2023-10-26 DIAGNOSIS — H33323 Round hole, bilateral: Secondary | ICD-10-CM | POA: Diagnosis not present

## 2023-10-31 NOTE — Progress Notes (Unsigned)
NEUROLOGY CONSULTATION NOTE  [name] MRN: [id number] DOB: 1996-01-16  Referring provider: [place] Frazier, OD Primary care provider: Oval Linsey, PA-C  Reason for consult:  papilledema  Assessment/Plan:   Papilledema - concern for idiopathic intracranial hypertension   MRI/MRV of head Followed by LP checking opening pressure and CSF cell count, protein, glucose, gram stain/culture and cytology Pending results, likely start acetazolamide 500mg  twice daily.  Will check BMP.    Subjective:  Karen Garner is a 27 year old right-handed female with heart murmur and atrophic retial hole OD (s/p treatment) who presents for papilledema.  History supplemented by referring provider's note.  She noticed slight headaches when she would use the computer for extended periods of time.  Resolves quickly when she looks away.  She saw the optometrist on 09/01/2023 for routine visit (the first in years) whose exam revealed mildly elevated ONH with blurred margins.  Repeat exam on 10/12/2023 revealed slightly worse edema via OCT, left worse than right.  Visual fields reportedly full.  MRV of head has been ordered but not performed because she never received a call to schedule.  Other than the small headaches with prolonged screen time, no headaches.  Denies visual obscurations or pulsatile tinnitus.  She is not on OCPs.  She did have a gastric sleeve surgery in June.  She has lost 50 lbs thus far.   PAST MEDICAL HISTORY: Past Medical History:  Diagnosis Date   Heart murmur     PAST SURGICAL HISTORY: Past Surgical History:  Procedure Laterality Date   IUD REMOVAL     LAPAROSCOPIC GASTRIC SLEEVE RESECTION N/A 05/16/2023   Procedure: LAPAROSCOPIC SLEEVE GASTRECTOMY;  Surgeon: Gaynelle Adu, MD;  Location: [facility];  Service: General;  Laterality: N/A;   TONSILLECTOMY     UPPER GI ENDOSCOPY N/A 05/16/2023   Procedure: UPPER GI ENDOSCOPY;  Surgeon: Gaynelle Adu, MD;  Location: [facility];  Service:  General;  Laterality: N/A;    MEDICATIONS: Current Outpatient Medications on File Prior to Visit  Medication Sig Dispense Refill   albuterol (VENTOLIN HFA) 108 (90 Base) MCG/ACT inhaler Inhale 1-2 puffs into the lungs every 6 (six) hours as needed for wheezing or shortness of breath.     enoxaparin (LOVENOX) 60 MG/0.6ML injection Inject 0.6 mLs (60 mg total) into the skin every 12 (twelve) hours for 14 days. 16.8 mL 0   ondansetron (ZOFRAN-ODT) 4 MG disintegrating tablet Take 1 tablet (4 mg total) by mouth every 6 (six) hours as needed for nausea or vomiting. (Patient not taking: Reported on 09/07/2023) 20 tablet 0   pantoprazole (PROTONIX) 40 MG tablet Take 1 tablet (40 mg total) by mouth daily. 90 tablet 0   traMADol (ULTRAM) 50 MG tablet Take 1 tablet (50 mg total) by mouth every 6 (six) hours as needed (pain). (Patient not taking: Reported on 09/07/2023) 10 tablet 0   No current facility-administered medications on file prior to visit.    ALLERGIES: No Known Allergies  FAMILY HISTORY: Family History  Problem Relation Age of Onset   Hypertension Father     Objective:  Blood pressure 136/79, pulse 80, height 5\' 3"  (1.6 m), weight 300 lb (136.1 kg), last menstrual period 10/26/2023, SpO2 99%. General: No acute distress.  Patient appears well-groomed.   Head:  Normocephalic/atraumatic Eyes:  fundi examined but not visualized Neck: supple, no paraspinal tenderness, full range of motion Heart: regular rate and rhythm Neurological Exam: Mental status: alert and oriented to person, place, and time, speech fluent and  not dysarthric, language intact. Cranial nerves: CN I: not tested CN II: pupils equal, round and reactive to light, visual fields intact CN III, IV, VI:  full range of motion, no nystagmus, no ptosis CN V: facial sensation intact. CN VII: upper and lower face symmetric CN VIII: hearing intact CN IX, X: gag intact, uvula midline CN XI: sternocleidomastoid and trapezius  muscles intact CN XII: tongue midline Bulk & Tone: normal, no fasciculations. Motor:  muscle strength 5/5 throughout Sensation:  Pinprick and vibratory sensation intact. Deep Tendon Reflexes:  2+ throughout,  toes downgoing.   Finger to nose testing:  Without dysmetria.   Gait:  Normal station and stride.  Romberg negative.    Thank you for allowing me to take part in the care of this patient.  Shon Millet, DO  CC:  Oval Linsey, PA-C  [place] Frazier, [place]

## 2023-11-01 ENCOUNTER — Other Ambulatory Visit: Payer: 59

## 2023-11-01 ENCOUNTER — Encounter: Payer: Self-pay | Admitting: Neurology

## 2023-11-01 ENCOUNTER — Ambulatory Visit (INDEPENDENT_AMBULATORY_CARE_PROVIDER_SITE_OTHER): Payer: 59 | Admitting: Neurology

## 2023-11-01 VITALS — BP 136/79 | HR 80 | Ht 63.0 in | Wt 300.0 lb

## 2023-11-01 DIAGNOSIS — H4710 Unspecified papilledema: Secondary | ICD-10-CM

## 2023-11-01 DIAGNOSIS — Z79899 Other long term (current) drug therapy: Secondary | ICD-10-CM | POA: Diagnosis not present

## 2023-11-01 NOTE — Patient Instructions (Signed)
Check MRV of head as well as MRI of brain Following MRI, will schedule a spinal tap to check opening pressure and check spinal fluid cell count, protein, glucose, gram stain/culture and cytology Expecting spinal fluid pressure to be elevated, will start a medication to lower it (acetazolamide) Continue working on weight loss Follow up 5 months or sooner if needed    Idiopathic Intracranial Hypertension  Idiopathic intracranial hypertension (IIH) is a condition that increases pressure around the brain. The fluid that surrounds the brain and spinal cord (cerebrospinal fluid, or CSF) increases and causes the pressure. Idiopathic means that the cause of this condition is not known. IIH affects the brain and spinal cord. If this condition is not treated, it can cause vision loss or blindness. What are the causes? The cause of this condition is not known. What increases the risk? The following factors may make you more likely to develop this condition: Being obese. Being a person who is female, between the ages of 45 and 73 years old, and who has not gone through menopause. Taking certain medicines, such as birth control, acne medicines, or steroids. What are the signs or symptoms? Symptoms of this condition include: Headaches. This is the most common symptom. Brief periods of total blindness. Double vision, blurred vision, or poor side (peripheral) vision. Pain in the shoulders or neck. Nausea and vomiting. A sound like rushing water or a pulsing sound within the ears (pulsatile tinnitus), or ringing in the ears. How is this diagnosed? This condition may be diagnosed based on: Your symptoms and medical history. Imaging tests of the brain, such as: CT scan. MRI. Magnetic resonance venogram (MRV) to check the veins. Diagnostic lumbar puncture. This is a procedure to remove and examine a sample of CSF. This procedure can determine whether your fluid pressure is too high. An eye exam to  check for swelling or nerve damage in the eyes. How is this treated? Treatment for this condition depends on the symptoms. The goal of treatment is to decrease the pressure around your brain. Common treatments include: Weight loss through healthy eating, salt restriction, and exercise, if you are overweight. Medicines to decrease the production of CSF and lower the pressure within your skull. Medicines to prevent or treat headaches. Other treatments may include: Surgery to place drains (shunts) in your brain to remove extra fluid. Lumbar puncture to remove extra CSF. Follow these instructions at home: If you are overweight or obese, work with your health care provider to lose weight. Take over-the-counter and prescription medicines only as told by your health care provider. Ask your health care provider if the medicine prescribed to you requires you to avoid driving or using machinery. Do not use any products that contain nicotine or tobacco. These products include cigarettes, chewing tobacco, and vaping devices, such as e-cigarettes. If you need help quitting, ask your health care provider. Keep all follow-up visits. Your health care provider will need to monitor you regularly. Contact a health care provider if: You have changes in your vision, such as: Double vision. Blurred vision. Poor peripheral vision. Get help right away if: You have any of the following symptoms and they get worse or do not get better: Headaches. Nausea. Vomiting. Sudden trouble seeing. This information is not intended to replace advice given to you by your health care provider. Make sure you discuss any questions you have with your health care provider. Document Revised: 04/13/2022 Document Reviewed: 03/23/2022 Elsevier Patient Education  2024 [employer].

## 2023-11-02 LAB — BASIC METABOLIC PANEL
BUN: 10 mg/dL (ref 7–25)
CO2: 29 mmol/L (ref 20–32)
Calcium: 9.1 mg/dL (ref 8.6–10.2)
Chloride: 107 mmol/L (ref 98–110)
Creat: 0.83 mg/dL (ref 0.50–0.96)
Glucose, Bld: 65 mg/dL (ref 65–99)
Potassium: 4.2 mmol/L (ref 3.5–5.3)
Sodium: 140 mmol/L (ref 135–146)

## 2023-11-27 ENCOUNTER — Encounter (HOSPITAL_BASED_OUTPATIENT_CLINIC_OR_DEPARTMENT_OTHER): Payer: Self-pay | Admitting: Emergency Medicine

## 2023-11-27 DIAGNOSIS — N3000 Acute cystitis without hematuria: Secondary | ICD-10-CM | POA: Insufficient documentation

## 2023-11-27 DIAGNOSIS — B349 Viral infection, unspecified: Secondary | ICD-10-CM | POA: Insufficient documentation

## 2023-11-27 DIAGNOSIS — R197 Diarrhea, unspecified: Secondary | ICD-10-CM | POA: Diagnosis present

## 2023-11-27 DIAGNOSIS — Z3490 Encounter for supervision of normal pregnancy, unspecified, unspecified trimester: Secondary | ICD-10-CM | POA: Insufficient documentation

## 2023-11-27 LAB — LIPASE, BLOOD: Lipase: <10 U/L — ABNORMAL LOW (ref 11–51)

## 2023-11-27 LAB — COMPREHENSIVE METABOLIC PANEL
ALT: 17 U/L (ref 0–44)
AST: 14 U/L — ABNORMAL LOW (ref 15–41)
Albumin: 4.1 g/dL (ref 3.5–5.0)
Alkaline Phosphatase: 58 U/L (ref 38–126)
Anion gap: 8 (ref 5–15)
BUN: 9 mg/dL (ref 6–20)
CO2: 22 mmol/L (ref 22–32)
Calcium: 9 mg/dL (ref 8.9–10.3)
Chloride: 106 mmol/L (ref 98–111)
Creatinine, Ser: 0.78 mg/dL (ref 0.44–1.00)
GFR, Estimated: >60 mL/min (ref >60)
Glucose, Bld: 99 mg/dL (ref 70–99)
Potassium: 3.6 mmol/L (ref 3.5–5.1)
Sodium: 136 mmol/L (ref 135–145)
Total Bilirubin: 0.4 mg/dL (ref <1.2)
Total Protein: 8.1 g/dL (ref 6.5–8.1)

## 2023-11-27 LAB — CBC
HCT: 35.9 % — ABNORMAL LOW (ref 36.0–46.0)
Hemoglobin: 11.2 g/dL — ABNORMAL LOW (ref 12.0–15.0)
MCH: 24.8 pg — ABNORMAL LOW (ref 26.0–34.0)
MCHC: 31.2 g/dL (ref 30.0–36.0)
MCV: 79.6 fL — ABNORMAL LOW (ref 80.0–100.0)
Platelets: 395 10*3/uL (ref 150–400)
RBC: 4.51 MIL/uL (ref 3.87–5.11)
RDW: 13.9 % (ref 11.5–15.5)
WBC: 6.3 10*3/uL (ref 4.0–10.5)
nRBC: 0 % (ref 0.0–0.2)

## 2023-11-27 LAB — URINALYSIS, ROUTINE W REFLEX MICROSCOPIC
Bilirubin Urine: NEGATIVE
Glucose, UA: NEGATIVE mg/dL
Hgb urine dipstick: NEGATIVE
Ketones, ur: 40 mg/dL — AB
Nitrite: NEGATIVE
Specific Gravity, Urine: 1.035 — ABNORMAL HIGH (ref 1.005–1.030)
pH: 6.5 (ref 5.0–8.0)

## 2023-11-27 LAB — RESP PANEL BY RT-PCR (RSV, FLU A&B, COVID)  RVPGX2
Influenza A by PCR: NEGATIVE
Influenza B by PCR: NEGATIVE
Resp Syncytial Virus by PCR: NEGATIVE
SARS Coronavirus 2 by RT PCR: NEGATIVE

## 2023-11-27 LAB — PREGNANCY, URINE: Preg Test, Ur: POSITIVE — AB

## 2023-11-27 NOTE — ED Triage Notes (Signed)
Upper abdo pain  Lower back pain Started around 11/23/23  Also reports some upper respiratory congestion

## 2023-11-28 ENCOUNTER — Other Ambulatory Visit: Payer: Self-pay

## 2023-11-28 ENCOUNTER — Emergency Department (HOSPITAL_BASED_OUTPATIENT_CLINIC_OR_DEPARTMENT_OTHER)
Admission: EM | Admit: 2023-11-28 | Discharge: 2023-11-28 | Disposition: A | Discharge status: Home / Self Care | Payer: 59 | Attending: Emergency Medicine | Admitting: Emergency Medicine

## 2023-11-28 DIAGNOSIS — Z3490 Encounter for supervision of normal pregnancy, unspecified, unspecified trimester: Secondary | ICD-10-CM

## 2023-11-28 DIAGNOSIS — N3000 Acute cystitis without hematuria: Secondary | ICD-10-CM

## 2023-11-28 DIAGNOSIS — B349 Viral infection, unspecified: Secondary | ICD-10-CM

## 2023-11-28 MED ORDER — CEPHALEXIN 500 MG PO CAPS: 500.0000 mg | ORAL_CAPSULE | Freq: Three times a day (TID) | ORAL | 0 refills | Status: AC

## 2023-11-28 NOTE — ED Provider Notes (Signed)
[facility]ERGENCY [facility] Emergency Department Provider Note MRN:  [id number]  Arrival date & time: 11/28/23     Chief Complaint   Multiple complaints History of Present Illness   Karen Garner is a 27 y.o. year-old female with no pertinent past medical history presenting to the ED with chief complaint of multiple complaints.  Cough and runny nose, occasional cramping abdominal pain during bouts of diarrhea.  No vaginal bleeding or discharge, no dysuria or hematuria, some bodyaches such as leg pain and lower back pain.  No numbness or weakness to the arms or legs, no loss of control of bowel or bladder.  No chest pain.  Review of Systems  A thorough review of systems was obtained and all systems are negative except as noted in the HPI and PMH.   Patient's Health History    Past Medical History:  Diagnosis Date   Heart murmur     Past Surgical History:  Procedure Laterality Date   IUD REMOVAL     LAPAROSCOPIC GASTRIC SLEEVE RESECTION N/A 05/16/2023   Procedure: LAPAROSCOPIC SLEEVE GASTRECTOMY;  Surgeon: Gaynelle Adu, MD;  Location: [facility];  Service: General;  Laterality: N/A;   TONSILLECTOMY     UPPER GI ENDOSCOPY N/A 05/16/2023   Procedure: UPPER GI ENDOSCOPY;  Surgeon: Gaynelle Adu, MD;  Location: [facility];  Service: General;  Laterality: N/A;    Family History  Problem Relation Age of Onset   Hypertension Father    Lung cancer Father    Seizures Maternal Aunt     Social History   Socioeconomic History   Marital status: Single    Spouse name: Not on file   Number of children: 0   Years of education: Not on file   Highest education level: Not on file  Occupational History   Occupation: Bailbondsman  Tobacco Use   Smoking status: Never   Smokeless tobacco: Never  Vaping Use   Vaping status: Never Used  Substance and Sexual Activity   Alcohol use: Yes    Comment: socially   Drug use: Not Currently    Types: Marijuana   Sexual activity: Yes  Other Topics  Concern   Not on file  Social History Narrative   Not on file   Social Drivers of Health   Financial Resource Strain: Not on file  Food Insecurity: No Food Insecurity (05/16/2023)   Hunger Vital Sign    Worried About Running Out of Food in the Last Year: Never true    Ran Out of Food in the Last Year: Never true  Transportation Needs: No Transportation Needs (05/16/2023)   PRAPARE - Administrator, Civil Service (Medical): No    Lack of Transportation (Non-Medical): No  Physical Activity: Not on file  Stress: Not on file  Social Connections: Not on file  Intimate Partner Violence: Not At Risk (05/16/2023)   Humiliation, Afraid, Rape, and Kick questionnaire    Fear of Current or Ex-Partner: No    Emotionally Abused: No    Physically Abused: No    Sexually Abused: No     Physical Exam   Vitals:   11/27/23 2226 11/28/23 0100  BP: 136/80 104/82  Pulse: 98 77  Resp: 18 18  Temp: 97.8 F (36.6 C)   SpO2: 100% 100%    CONSTITUTIONAL: Well-appearing, NAD NEURO/PSYCH:  Alert and oriented x 3, no focal deficits EYES:  eyes equal and reactive ENT/NECK:  no LAD, no JVD CARDIO: Regular rate, well-perfused, normal S1 and  S2 PULM:  CTAB no wheezing or rhonchi GI/GU:  non-distended, non-tender MSK/SPINE:  No gross deformities, no edema SKIN:  no rash, atraumatic   *Additional and/or pertinent findings included in MDM below  Diagnostic and Interventional Summary    EKG Interpretation Date/Time:    Ventricular Rate:    PR Interval:    QRS Duration:    QT Interval:    QTC Calculation:   R Axis:      Text Interpretation:         Labs Reviewed  LIPASE, BLOOD - Abnormal; Notable for the following components:      Result Value   Lipase <10 (*)    All other components within normal limits  COMPREHENSIVE METABOLIC PANEL - Abnormal; Notable for the following components:   AST 14 (*)    All other components within normal limits  CBC - Abnormal; Notable for the  following components:   Hemoglobin 11.2 (*)    HCT 35.9 (*)    MCV 79.6 (*)    MCH 24.8 (*)    All other components within normal limits  URINALYSIS, ROUTINE W REFLEX MICROSCOPIC - Abnormal; Notable for the following components:   Specific Gravity, Urine 1.035 (*)    Ketones, ur 40 (*)    Protein, ur TRACE (*)    Leukocytes,Ua MODERATE (*)    Bacteria, UA FEW (*)    All other components within normal limits  PREGNANCY, URINE - Abnormal; Notable for the following components:   Preg Test, Ur POSITIVE (*)    All other components within normal limits  RESP PANEL BY RT-PCR (RSV, FLU A&B, COVID)  [id number]    No orders to display    Medications - No data to display   Procedures  /  Critical Care Procedures  ED Course and Medical Decision Making  Initial Impression and Ddx Suspect viral illness given constellation of symptoms.  Patient is well-appearing nontoxic with normal vital signs, completely soft and nontender abdomen with no rebound guarding or rigidity.  Lungs clear.  Doubt emergent process.  Past medical/surgical history that increases complexity of ED encounter: None  Interpretation of Diagnostics COVID flu and RSV negative, urinalysis with some evidence to suggest infection.  Positive pregnancy test.  Patient Reassessment and Ultimate Disposition/Management     Given the lack of vaginal bleeding, no abdominal pain or tenderness, highly doubt ectopic pregnancy.  Will treat for UTI, have patient follow-up with OB/GYN, strict return precautions.  Patient management required discussion with the following services or consulting groups:  None  Complexity of Problems Addressed Acute complicated illness or Injury  Additional Data Reviewed and Analyzed Further history obtained from: Further history from spouse/family member  Additional Factors Impacting ED Encounter Risk Prescriptions  Elmer Sow. Pilar Plate, MD [facility] Health Emergency Medicine [facility]  Health [email or web address]  Final Clinical Impressions(s) / ED Diagnoses     ICD-10-CM   1. Viral illness  B34.9     2. Acute cystitis without hematuria  N30.00     3. Early stage of pregnancy  Z34.90       ED Discharge Orders          Ordered    cephALEXin (KEFLEX) 500 MG capsule  3 times daily        11/28/23 0206             Discharge Instructions Discussed with and Provided to Patient:    Discharge Instructions      You were evaluated in the  Emergency Department and after careful evaluation, we did not find any emergent condition requiring admission or further testing in the hospital.  Your exam/testing today is overall reassuring.  Your symptoms are likely due to a viral illness.  You also have a positive pregnancy test and evidence of a urinary tract infection.  Take the Keflex antibiotic as directed.  Recommended daily prenatal vitamin and follow-up with an OB/GYN doctor.  Please return to the Emergency Department if you experience any worsening of your condition.   Thank you for allowing [place] to be a part of your care.      Sabas Sous, MD 11/28/23 [phone]

## 2023-11-28 NOTE — Discharge Instructions (Signed)
You were evaluated in the Emergency Department and after careful evaluation, we did not find any emergent condition requiring admission or further testing in the hospital.  Your exam/testing today is overall reassuring.  Your symptoms are likely due to a viral illness.  You also have a positive pregnancy test and evidence of a urinary tract infection.  Take the Keflex antibiotic as directed.  Recommended daily prenatal vitamin and follow-up with an OB/GYN doctor.  Please return to the Emergency Department if you experience any worsening of your condition.   Thank you for allowing [place] to be a part of your care.

## 2023-11-29 ENCOUNTER — Other Ambulatory Visit: Payer: 59

## 2023-12-15 NOTE — Progress Notes (Shared)
[facility] - Clinic Note  12/27/2023   CHIEF COMPLAINT Patient presents for No chief complaint on file.  HISTORY OF PRESENT ILLNESS: Karen Garner is a 27 y.o. female who presents to the clinic today for:   Pt feels like she needs a different glasses Rx, she has stopped wearing them bc her vision is better without them than with them, pt has an appt with neurology on December 3rd  Referring physician: Frazier, [place], OD [address] [place],  [place] [address]  HISTORICAL INFORMATION:  Selected notes from the MEDICAL RECORD NUMBER Referred by Dr. Bascom Levels for concern of retinal tear LEE:  Ocular Hx- PMH-   CURRENT MEDICATIONS: No current outpatient medications on file. (Ophthalmic Drugs)   No current facility-administered medications for this visit. (Ophthalmic Drugs)   Current Outpatient Medications (Other)  Medication Sig   albuterol (VENTOLIN HFA) 108 (90 Base) MCG/ACT inhaler Inhale 1-2 puffs into the lungs every 6 (six) hours as needed for wheezing or shortness of breath.   pantoprazole (PROTONIX) 40 MG tablet Take 1 tablet (40 mg total) by mouth daily.   No current facility-administered medications for this visit. (Other)   REVIEW OF SYSTEMS:   ALLERGIES No Known Allergies PAST MEDICAL HISTORY Past Medical History:  Diagnosis Date   Heart murmur    Past Surgical History:  Procedure Laterality Date   IUD REMOVAL     LAPAROSCOPIC GASTRIC SLEEVE RESECTION N/A 05/16/2023   Procedure: LAPAROSCOPIC SLEEVE GASTRECTOMY;  Surgeon: Gaynelle Adu, MD;  Location: [facility];  Service: General;  Laterality: N/A;   TONSILLECTOMY     UPPER GI ENDOSCOPY N/A 05/16/2023   Procedure: UPPER GI ENDOSCOPY;  Surgeon: Gaynelle Adu, MD;  Location: [facility];  Service: General;  Laterality: N/A;   FAMILY HISTORY Family History  Problem Relation Age of Onset   Hypertension Father    Lung cancer Father    Seizures Maternal Aunt    SOCIAL HISTORY Social  History   Tobacco Use   Smoking status: Never   Smokeless tobacco: Never  Vaping Use   Vaping status: Never Used  Substance Use Topics   Alcohol use: Yes    Comment: socially   Drug use: Not Currently    Types: Marijuana       OPHTHALMIC EXAM:  Not recorded    IMAGING AND PROCEDURES  Imaging and Procedures for 12/27/2023         ASSESSMENT/PLAN:   ICD-10-CM   1. 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 Retinal hole of both eyes  H33.323     4. 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 Retinal detachment with single break, OD - atrophic hole with surrounding SRF at 0100 - s/p laser retinopexy OD 10.09.24 for focal RD and lattice -- good laser in place - completed PF QID OD x7 days - no new RT/RD - f/u in 2-3 months, DFE, OCT  2,3. Lattice degeneration w/ atrophic holes, both eyes - OD: focal lattice with VR tuft at 1200, lattice from [phone], focal patch of lattice at 1030 (almost to ora) - OS: 3 small retinal holes with shallow SRF at 1130, lattice with VR tuft from 1200-0100, patch of lattice with multiple holes from 0430-0800 - s/p laser retinopexy OD (10.09.24) -- good laser changes in place - s/p laser retinopexy OS (11.06.24) --  good laser changes in place  - completed PF QID OS x7 days - no new RT/RD or lattice OU - f/u in 2-3 month, sooner prn -- POV  4. 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 Bascom Levels for further evaluation -- agree  - Neurology appointment scheduled for 12.03.24  Ophthalmic Meds Ordered this visit:  No orders of the defined types were  placed in this encounter.    No follow-ups on file.  There are no Patient Instructions on file for this visit.  Explained the diagnoses, plan, and follow up with the patient and they expressed understanding.  Patient expressed understanding of the importance of proper follow up care.   This document serves as a record of services personally performed by Karie Chimera, MD, PhD. It was created on their behalf by Charlette Caffey, COT an ophthalmic technician. The creation of this record is the provider's dictation and/or activities during the visit.    Electronically signed by:  Charlette Caffey, COT  12/15/23 10:54 AM   Karie Chimera, M.D., Ph.D. Diseases & Surgery of the Retina and Vitreous [facility] 12/27/2023    Abbreviations: M myopia (nearsighted); A astigmatism; H hyperopia (farsighted); P presbyopia; Mrx spectacle prescription;  CTL contact lenses; OD right eye; OS left eye; OU both eyes  XT exotropia; ET esotropia; PEK punctate epithelial keratitis; PEE punctate epithelial erosions; DES dry eye syndrome; MGD meibomian gland dysfunction; ATs artificial tears; PFAT's preservative free artificial tears; NSC nuclear sclerotic cataract; PSC posterior subcapsular cataract; ERM epi-retinal membrane; PVD posterior vitreous detachment; RD retinal detachment; DM diabetes mellitus; DR diabetic retinopathy; NPDR non-proliferative diabetic retinopathy; PDR proliferative diabetic retinopathy; CSME clinically significant macular edema; DME diabetic macular edema; dbh dot blot hemorrhages; CWS cotton wool spot; POAG primary open angle glaucoma; C/D cup-to-disc ratio; HVF humphrey visual field; GVF goldmann visual field; OCT optical coherence tomography; IOP intraocular pressure; BRVO Branch retinal vein occlusion; CRVO central retinal vein occlusion; CRAO central retinal artery occlusion; BRAO branch retinal artery occlusion; RT retinal tear; SB scleral buckle; PPV pars  plana vitrectomy; VH Vitreous hemorrhage; PRP panretinal laser photocoagulation; IVK intravitreal kenalog; VMT vitreomacular traction; MH Macular hole;  NVD neovascularization of the disc; NVE neovascularization elsewhere; AREDS age related eye disease study; ARMD age related macular degeneration; POAG primary open angle glaucoma; EBMD epithelial/anterior basement membrane dystrophy; ACIOL anterior chamber intraocular lens; IOL intraocular lens; PCIOL posterior chamber intraocular lens; Phaco/IOL phacoemulsification with intraocular lens placement; PRK photorefractive keratectomy; LASIK laser assisted in situ keratomileusis; HTN hypertension; DM diabetes mellitus; COPD chronic obstructive pulmonary disease

## 2023-12-27 ENCOUNTER — Encounter (INDEPENDENT_AMBULATORY_CARE_PROVIDER_SITE_OTHER): Payer: 59 | Admitting: Ophthalmology

## 2023-12-27 DIAGNOSIS — H33323 Round hole, bilateral: Secondary | ICD-10-CM

## 2023-12-27 DIAGNOSIS — H4710 Unspecified papilledema: Secondary | ICD-10-CM

## 2023-12-27 DIAGNOSIS — H35413 Lattice degeneration of retina, bilateral: Secondary | ICD-10-CM

## 2023-12-27 DIAGNOSIS — H33011 Retinal detachment with single break, right eye: Secondary | ICD-10-CM

## 2024-02-05 ENCOUNTER — Emergency Department (HOSPITAL_COMMUNITY)

## 2024-02-05 ENCOUNTER — Encounter (HOSPITAL_COMMUNITY): Payer: Self-pay

## 2024-02-05 ENCOUNTER — Other Ambulatory Visit: Payer: Self-pay

## 2024-02-05 ENCOUNTER — Emergency Department (HOSPITAL_COMMUNITY)
Admission: EM | Admit: 2024-02-05 | Discharge: 2024-02-05 | Disposition: A | Discharge status: Home / Self Care | Attending: Emergency Medicine | Admitting: Emergency Medicine

## 2024-02-05 DIAGNOSIS — M898X1 Other specified disorders of bone, shoulder: Secondary | ICD-10-CM | POA: Insufficient documentation

## 2024-02-05 DIAGNOSIS — M545 Low back pain, unspecified: Secondary | ICD-10-CM | POA: Diagnosis not present

## 2024-02-05 DIAGNOSIS — M25552 Pain in left hip: Secondary | ICD-10-CM | POA: Insufficient documentation

## 2024-02-05 DIAGNOSIS — Y92410 Unspecified street and highway as the place of occurrence of the external cause: Secondary | ICD-10-CM | POA: Diagnosis not present

## 2024-02-05 DIAGNOSIS — Z79899 Other long term (current) drug therapy: Secondary | ICD-10-CM | POA: Diagnosis not present

## 2024-02-05 DIAGNOSIS — S62632A Displaced fracture of distal phalanx of right middle finger, initial encounter for closed fracture: Secondary | ICD-10-CM | POA: Diagnosis present

## 2024-02-05 LAB — PREGNANCY, URINE: Preg Test, Ur: NEGATIVE

## 2024-02-05 MED ORDER — MELOXICAM 7.5 MG PO TABS: 7.5000 mg | ORAL_TABLET | Freq: Every day | ORAL | 0 refills | Status: AC

## 2024-02-05 MED ORDER — METHOCARBAMOL 500 MG PO TABS: 500.0000 mg | ORAL_TABLET | Freq: Two times a day (BID) | ORAL | 0 refills | Status: AC

## 2024-02-05 MED ORDER — LIDOCAINE 5 % EX PTCH
2.0000 | MEDICATED_PATCH | CUTANEOUS | Status: DC
  Administered 2024-02-05: 2 via TRANSDERMAL
  Filled 2024-02-05: qty 2

## 2024-02-05 MED ORDER — ACETAMINOPHEN 325 MG PO TABS
975.0000 mg | ORAL_TABLET | Freq: Once | ORAL | Status: AC
  Administered 2024-02-05: 975 mg via ORAL
  Filled 2024-02-05: qty 3

## 2024-02-05 NOTE — ED Notes (Signed)
 Pt in xray

## 2024-02-05 NOTE — Discharge Instructions (Addendum)
 Thank you for letting us evaluate you today.  Your x-rays were negative for fracture.  You likely have muscle strain and contusions that are causing pain.You may use naproxen and Tylenol intermittently every 8 hours as needed for pain.  Please do not use naproxen with aspirin, Aleve, ibuprofen, Advil as they are all in the same family. Robaxin may cause drowsiness so do not operate heavy machinery including driving or drink alcohol with this.  You may take this at night or split the tablet in half if it makes you too drowsy.  Please follow up with hand specialist noted in paperwork.
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
## 2024-04-16 NOTE — Progress Notes (Deleted)
   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  Will check BMP.    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  UPDATE: Plan was to check MRI/MRV of head followed by LP.  Did not have testing done.  ***  HISTORY: She noticed slight headaches when she would use the computer for extended periods of time.  Resolves quickly when she looks away.  She saw the optometrist on 09/01/2023 for routine visit (the first in years) whose exam revealed mildly elevated ONH with blurred margins.  Repeat exam on 10/12/2023 revealed slightly worse edema via OCT, left worse than right.  Visual fields reportedly full.  MRV of head has been ordered but not performed because she never received a call to schedule.  Other than the small headaches with prolonged screen time, no headaches.  Denies visual obscurations or pulsatile tinnitus.  She is not on OCPs.  She did have a gastric sleeve surgery in June 2024.  She has lost 50 lbs thus far.  PAST MEDICAL HISTORY: Past Medical History:  Diagnosis Date   Heart murmur     MEDICATIONS: Current Outpatient Medications on File Prior to Visit  Medication Sig Dispense Refill   albuterol (VENTOLIN HFA) 108 (90 Base) MCG/ACT inhaler Inhale 1-2 puffs into the lungs every 6 (six) hours as needed for wheezing or shortness of breath.     meloxicam  (MOBIC ) 7.5 MG tablet Take 1 tablet (7.5 mg total) by mouth daily. 30 tablet 0   methocarbamol  (ROBAXIN ) 500 MG tablet Take 1 tablet (500 mg total) by mouth 2 (two) times daily. 20 tablet 0   pantoprazole  (PROTONIX ) 40 MG tablet  Take 1 tablet (40 mg total) by mouth daily. 90 tablet 0   No current facility-administered medications on file prior to visit.    ALLERGIES: No Known Allergies  FAMILY HISTORY: Family History  Problem Relation Age of Onset   Hypertension Father    Lung cancer Father    Seizures Maternal Aunt       Objective:  *** General: No acute distress.  Patient appears ***-groomed.   Head:  Normocephalic/atraumatic Eyes:  Fundi examined but not visualized Neck: supple, no paraspinal tenderness, full range of motion Heart:  Regular rate and rhythm Lungs:  Clear to auscultation bilaterally Back: No paraspinal tenderness Neurological Exam: alert and oriented.  Speech fluent and not dysarthric, language intact.  CN II-XII intact. Bulk and tone normal, muscle strength 5/5 throughout.  Sensation to light touch intact.  Deep tendon reflexes 2+ throughout, toes downgoing.  Finger to nose testing intact.  Gait normal, Romberg negative.   Janne Members, DO  CC: ***

## 2024-04-17 ENCOUNTER — Encounter: Payer: Self-pay | Admitting: Neurology

## 2024-04-17 ENCOUNTER — Ambulatory Visit: Payer: 59 | Admitting: Neurology

## 2024-09-24 DIAGNOSIS — Z23 Encounter for immunization: Secondary | ICD-10-CM | POA: Diagnosis not present

## 2024-09-24 DIAGNOSIS — Z6841 Body Mass Index (BMI) 40.0 and over, adult: Secondary | ICD-10-CM | POA: Diagnosis not present

## 2024-09-24 DIAGNOSIS — E66813 Obesity, class 3: Secondary | ICD-10-CM | POA: Diagnosis not present

## 2024-10-14 ENCOUNTER — Encounter (HOSPITAL_COMMUNITY): Payer: Self-pay

## 2024-10-14 ENCOUNTER — Emergency Department (HOSPITAL_COMMUNITY)

## 2024-10-14 ENCOUNTER — Other Ambulatory Visit: Payer: Self-pay

## 2024-10-14 ENCOUNTER — Emergency Department (HOSPITAL_COMMUNITY)
Admission: EM | Admit: 2024-10-14 | Discharge: 2024-10-14 | Disposition: A | Discharge status: Home / Self Care | Attending: Emergency Medicine | Admitting: Emergency Medicine

## 2024-10-14 DIAGNOSIS — M25562 Pain in left knee: Secondary | ICD-10-CM | POA: Insufficient documentation

## 2024-10-14 DIAGNOSIS — M25511 Pain in right shoulder: Secondary | ICD-10-CM | POA: Insufficient documentation

## 2024-10-14 DIAGNOSIS — Y92410 Unspecified street and highway as the place of occurrence of the external cause: Secondary | ICD-10-CM | POA: Diagnosis not present

## 2024-10-14 DIAGNOSIS — G8911 Acute pain due to trauma: Secondary | ICD-10-CM | POA: Diagnosis not present

## 2024-10-14 MED ORDER — LIDOCAINE 5 % EX PTCH
1.0000 | MEDICATED_PATCH | Freq: Once | CUTANEOUS | Status: DC
  Administered 2024-10-14: 1 via TRANSDERMAL
  Filled 2024-10-14: qty 1

## 2024-10-14 MED ORDER — IBUPROFEN 200 MG PO TABS
600.0000 mg | ORAL_TABLET | Freq: Once | ORAL | Status: AC
  Administered 2024-10-14: 600 mg via ORAL
  Filled 2024-10-14: qty 3

## 2024-10-14 MED ORDER — LIDOCAINE 5 % EX PTCH: 1.0000 | MEDICATED_PATCH | CUTANEOUS | 0 refills | Status: AC

## 2024-10-14 MED ORDER — ACETAMINOPHEN 500 MG PO TABS
1000.0000 mg | ORAL_TABLET | Freq: Once | ORAL | Status: AC
  Administered 2024-10-14: 1000 mg via ORAL
  Filled 2024-10-14: qty 2
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
## 2024-10-14 NOTE — ED Notes (Signed)
..  The patient is A&OX4, ambulatory at d/c with independent steady gait, NAD. Pt verbalized understanding of d/c instructions, prescriptions and follow up care.
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
## 2024-10-29 DIAGNOSIS — Z6841 Body Mass Index (BMI) 40.0 and over, adult: Secondary | ICD-10-CM | POA: Diagnosis not present

## 2024-10-29 DIAGNOSIS — E66813 Obesity, class 3: Secondary | ICD-10-CM | POA: Diagnosis not present

## 2024-10-29 DIAGNOSIS — M25561 Pain in right knee: Secondary | ICD-10-CM | POA: Diagnosis not present

## 2024-12-14 ENCOUNTER — Encounter (HOSPITAL_COMMUNITY): Payer: Self-pay | Admitting: *Deleted
